# Patient Record
Sex: Male | Born: 1960 | Race: Black or African American | Hispanic: No | Smoking: Current every day smoker
Health system: Southern US, Community
[De-identification: ages and names within clinical notes are randomized; demographics above are authoritative.]

## PROBLEM LIST (undated history)

## (undated) HISTORY — PX: PROSTATE SURGERY: SHX751

## (undated) HISTORY — PX: HIP SURGERY: SHX245

---

## 2006-09-25 ENCOUNTER — Ambulatory Visit: Payer: Self-pay | Admitting: Family Medicine

## 2006-09-25 LAB — CONVERTED CEMR LAB
AST: 22 units/L (ref 0–37)
Basophils Relative: 0.4 % (ref 0.0–1.0)
CO2: 30 meq/L (ref 19–32)
Cholesterol: 168 mg/dL (ref 0–200)
Creatinine, Ser: 1 mg/dL (ref 0.4–1.5)
Eosinophils Relative: 4.4 % (ref 0.0–5.0)
Glucose, Bld: 98 mg/dL (ref 70–99)
HCT: 41.3 % (ref 39.0–52.0)
Hemoglobin: 13.9 g/dL (ref 13.0–17.0)
LDL Cholesterol: 107 mg/dL — ABNORMAL HIGH (ref 0–99)
MCHC: 33.7 g/dL (ref 30.0–36.0)
Monocytes Absolute: 0.7 10*3/uL (ref 0.2–0.7)
Neutrophils Relative %: 59.9 % (ref 43.0–77.0)
PSA: 2.7 ng/mL (ref 0.10–4.00)
Potassium: 4.1 meq/L (ref 3.5–5.1)
RDW: 13.8 % (ref 11.5–14.6)
Sodium: 142 meq/L (ref 135–145)
TSH: 1.16 microintl units/mL (ref 0.35–5.50)
Total Bilirubin: 0.7 mg/dL (ref 0.3–1.2)
Total Protein: 6.9 g/dL (ref 6.0–8.3)
VLDL: 10 mg/dL (ref 0–40)
WBC: 7.4 10*3/uL (ref 4.5–10.5)

## 2010-08-07 NOTE — Assessment & Plan Note (Signed)
[facility] OFFICE NOTE   NAME:GRAVESKeishaun, Hazel                       MRN:          [id number]  DATE:09/25/2006                            DOB:          05/22/1960    This is a 50 year old gentleman here to establish with our practice.  He  is also for complete physical examination.  He does have one item he  would like me to address.  About 2 years ago he noticed a small lump  around the left testicle, it has been there ever since, it does not  particularly bother him and does not seem to be changing.  Occasionally  he also notices some very discomfort during ejaculations, most of the  time this is not present and at other times he has no scrotal or  testicular pain at all, no trouble with urinations.  He has seen no  blood in the semen or in the urine.  He did have a vasectomy in 100.   OTHER PAST MEDICAL HISTORY:  1. It has been years since he has seen a doctor for anything.  2. He checks his blood pressure at local pharmacies and occasionally      gets a reading that is a bit high but he has never been diagnosed      with hypertension.  3. 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  4. He has had a tonsillectomy.   ALLERGIES:  None.   CURRENT MEDICATIONS:  None.   HABITS:  He smokes 1/2 pack per day of cigarettes, he drinks some  alcohol.   SOCIAL HISTORY:  1. He is divorced and now engaged again.  2. He works as a Event organiser.   FAMILY HISTORY:  Remarkable for:  1. Hypertension.  2. Diabetes.   OBJECTIVE:  Height 6 feet 2 inches, weight 223, BP 96/72, pulse 70 and  regular.  GENERAL:  He appears to be healthy.  Skin is clear.  EYES:  Sclera is clear.  OROPHARYNX:  Clear.  NECK:  Supple without lymphadenopathy or masses.  LUNGS:  Clear.  CARDIAC:  Rate and rhythm  regular without gallops, murmurs or rubs.  Distal pulses are full.  ABDOMEN:  Soft, normal bowel sounds, nontender, no masses.  GENITALIA:  He is circumcised.  There is a small nontender, well  circumscribed, cystic lesion at the superior pole of the left testicle.  Otherwise, testicles and epididymides are nontender.  RECTAL EXAM:  No masses or tenderness.  Prostate is mildly enlarged but  smooth.  Stool is hemoccult negative.  EXTREMITIES:  No clubbing, cyanosis or edema.  NEUROLOGIC EXAM:  Grossly intact.   ASSESSMENT AND PLAN:  1. Complete physical.  He is fasting, so we will send him for the      usual laboratories.  I talked about getting more regular exercise      and the importance of stopping smoking.  2. Scrotal lump, probably an epididymal cyst.  Will set up a scrotal      ultrasound soon for further evaluation.  3. Occasional discomfort during ejaculation.  I told him this is      probably a benign problem and he admits that it does not really      bother him that much.  Assuming his urinalysis and laboratories are      normal, I think we will simply observe it at this point and work it      up further if it bothers him more at some point.     Tera Mater. Clent Ridges, MD  Electronically Signed    SAF/MedQ  DD: 09/25/2006  DT: 09/25/2006  Job #: [id number]

## 2012-11-23 ENCOUNTER — Emergency Department (HOSPITAL_BASED_OUTPATIENT_CLINIC_OR_DEPARTMENT_OTHER): Payer: Non-veteran care

## 2012-11-23 ENCOUNTER — Emergency Department (HOSPITAL_BASED_OUTPATIENT_CLINIC_OR_DEPARTMENT_OTHER)
Admission: EM | Admit: 2012-11-23 | Discharge: 2012-11-23 | Disposition: A | Discharge status: Other Acute Inpatient Hospital | Payer: Non-veteran care | Attending: Emergency Medicine | Admitting: Emergency Medicine

## 2012-11-23 ENCOUNTER — Encounter (HOSPITAL_BASED_OUTPATIENT_CLINIC_OR_DEPARTMENT_OTHER): Payer: Self-pay | Admitting: *Deleted

## 2012-11-23 DIAGNOSIS — K56609 Unspecified intestinal obstruction, unspecified as to partial versus complete obstruction: Secondary | ICD-10-CM | POA: Insufficient documentation

## 2012-11-23 DIAGNOSIS — Z79899 Other long term (current) drug therapy: Secondary | ICD-10-CM | POA: Insufficient documentation

## 2012-11-23 DIAGNOSIS — Z87891 Personal history of nicotine dependence: Secondary | ICD-10-CM | POA: Insufficient documentation

## 2012-11-23 DIAGNOSIS — R110 Nausea: Secondary | ICD-10-CM | POA: Insufficient documentation

## 2012-11-23 LAB — URINE MICROSCOPIC-ADD ON

## 2012-11-23 LAB — URINALYSIS, ROUTINE W REFLEX MICROSCOPIC
Nitrite: NEGATIVE
Protein, ur: 30 mg/dL — AB
Urobilinogen, UA: 0.2 mg/dL (ref 0.0–1.0)

## 2012-11-23 LAB — CBC WITH DIFFERENTIAL/PLATELET
Eosinophils Relative: 0 % (ref 0–5)
HCT: 37.3 % — ABNORMAL LOW (ref 39.0–52.0)
Hemoglobin: 13.1 g/dL (ref 13.0–17.0)
Lymphocytes Relative: 11 % — ABNORMAL LOW (ref 12–46)
Lymphs Abs: 0.8 10*3/uL (ref 0.7–4.0)
MCV: 92.1 fL (ref 78.0–100.0)
Monocytes Absolute: 1.3 10*3/uL — ABNORMAL HIGH (ref 0.1–1.0)
Monocytes Relative: 17 % — ABNORMAL HIGH (ref 3–12)
Neutro Abs: 5.3 10*3/uL (ref 1.7–7.7)
WBC: 7.4 10*3/uL (ref 4.0–10.5)

## 2012-11-23 LAB — COMPREHENSIVE METABOLIC PANEL
AST: 28 U/L (ref 0–37)
BUN: 8 mg/dL (ref 6–23)
CO2: 26 mEq/L (ref 19–32)
Chloride: 99 mEq/L (ref 96–112)
Creatinine, Ser: 0.9 mg/dL (ref 0.50–1.35)
GFR calc Af Amer: >90 mL/min (ref >90)
GFR calc non Af Amer: >90 mL/min (ref >90)
Glucose, Bld: 124 mg/dL — ABNORMAL HIGH (ref 70–99)
Total Bilirubin: 0.6 mg/dL (ref 0.3–1.2)

## 2012-11-23 MED ORDER — HYDROMORPHONE HCL PF 1 MG/ML IJ SOLN
0.5000 mg | Freq: Once | INTRAMUSCULAR | Status: AC
  Administered 2012-11-23: 0.5 mg via INTRAVENOUS

## 2012-11-23 MED ORDER — HYDROMORPHONE HCL PF 1 MG/ML IJ SOLN
0.5000 mg | Freq: Once | INTRAMUSCULAR | Status: AC
  Administered 2012-11-23: 0.5 mg via INTRAVENOUS
  Filled 2012-11-23: qty 1

## 2012-11-23 MED ORDER — LIDOCAINE VISCOUS 2 % MT SOLN
OROMUCOSAL | Status: AC
  Administered 2012-11-23: 22:00:00
  Filled 2012-11-23: qty 15

## 2012-11-23 MED ORDER — SODIUM CHLORIDE 0.9 % IV SOLN
Freq: Once | INTRAVENOUS | Status: AC
  Administered 2012-11-23: 19:00:00 via INTRAVENOUS

## 2012-11-23 MED ORDER — ONDANSETRON HCL 4 MG/2ML IJ SOLN
4.0000 mg | Freq: Once | INTRAMUSCULAR | Status: AC
  Administered 2012-11-23: 4 mg via INTRAVENOUS
  Filled 2012-11-23: qty 2

## 2012-11-23 MED ORDER — HYDROMORPHONE HCL PF 1 MG/ML IJ SOLN
INTRAMUSCULAR | Status: AC
  Filled 2012-11-23: qty 1

## 2012-11-23 MED ORDER — IOHEXOL 300 MG/ML  SOLN
100.0000 mL | Freq: Once | INTRAMUSCULAR | Status: AC | PRN
  Administered 2012-11-23: 100 mL via INTRAVENOUS

## 2012-11-23 MED ORDER — HYDROMORPHONE HCL PF 1 MG/ML IJ SOLN
INTRAMUSCULAR | Status: AC
  Administered 2012-11-23: 0.5 mg
  Filled 2012-11-23: qty 1

## 2012-11-23 MED ORDER — SODIUM CHLORIDE 0.9 % IV BOLUS (SEPSIS)
1000.0000 mL | Freq: Once | INTRAVENOUS | Status: AC
  Administered 2012-11-23: 1000 mL via INTRAVENOUS

## 2012-11-23 MED ORDER — IOHEXOL 300 MG/ML  SOLN
50.0000 mL | Freq: Once | INTRAMUSCULAR | Status: AC | PRN
  Administered 2012-11-23: 50 mL via ORAL

## 2012-11-23 NOTE — ED Provider Notes (Signed)
CSN: [id number]     Arrival date & time 11/23/12  1657 History  This chart was scribed for Vida Roller, MD by Leone Payor, ED Scribe. This patient was seen in room MH10/MH10 and the patient's care was started 5:20 PM.    Chief Complaint  Patient presents with  . Abdominal Pain    The history is provided by the patient. No language interpreter was used.    HPI Comments: Justin Bauer is a 52 y.o. male who presents to the Emergency Department complaining of constant, worsening abdominal pain with associated bloating and nausea that began yesterday. Pt reports having prostate surgery 5 days ago at the [facility]. He reports last BM was prior to the surgery. He also is having drainage from one of the incision sites on the left abdomen. He denies fever, SOB, back pain, emesis. He denies history of other abdominal surgeries.   History reviewed. No pertinent past medical history. Past Surgical History  Procedure Laterality Date  . Prostate surgery    . Hip surgery     No family history on file. History  Substance Use Topics  . Smoking status: Former Smoker    Types: Cigarettes    Quit date: 09/22/2012  . Smokeless tobacco: Not on file  . Alcohol Use: Yes    Review of Systems A complete 10 system review of systems was obtained and all systems are negative except as noted in the HPI and PMH.   Allergies  Review of patient's allergies indicates no known allergies.  Home Medications   Current Outpatient Rx  Name  Route  Sig  Dispense  Refill  . ferrous sulfate 325 (65 FE) MG tablet   Oral   Take 325 mg by mouth daily with breakfast.         . MELOXICAM PO   Oral   Take by mouth.         . oxybutynin (DITROPAN) 5 MG tablet   Oral   Take 5 mg by mouth 3 (three) times daily.         . Oxycodone-Acetaminophen (PERCOCET PO)   Oral   Take by mouth.         . senna (SENOKOT) 8.6 MG tablet   Oral   Take 1 tablet by mouth daily.          BP 149/91  Pulse 68   Temp(Src) 98.6 F (37 C) (Oral)  Resp 20  Ht 6\' 2"  (1.88 m)  Wt 220 lb (99.791 kg)  BMI 28.23 kg/m2  SpO2 100% Physical Exam  Nursing note and vitals reviewed. Constitutional: He is oriented to person, place, and time. He appears well-developed and well-nourished. No distress.  Appears uncomfortable.  HENT:  Head: Normocephalic and atraumatic.  Mouth/Throat: Oropharynx is clear and moist. Mucous membranes are dry.  Eyes: Conjunctivae are normal. Pupils are equal, round, and reactive to light. No scleral icterus.  Neck: Neck supple.  Cardiovascular: Normal rate, regular rhythm, normal heart sounds and intact distal pulses.   No murmur heard. Pulmonary/Chest: Effort normal and breath sounds normal. No stridor. No respiratory distress. He has no wheezes. He has no rales.  Abdominal: Soft. He exhibits no distension. There is tenderness (abdominal tenderness, upper greater than lower).  Diffusely tympanitic to percussion. Several incisions most of which are healing. LLQ has a 1.5 cm incision that is open with clear serous drainage.  Musculoskeletal: Normal range of motion. He exhibits no edema.  Neurological: He is alert and oriented  to person, place, and time.  Skin: Skin is warm and dry. No rash noted.  Right lower abdominal wall is ecchymotic.   Psychiatric: He has a normal mood and affect. His behavior is normal.    ED Course  Procedures (including critical care time)  DIAGNOSTIC STUDIES: Oxygen Saturation is 99% on RA, normal by my interpretation.    COORDINATION OF CARE: 5:25 PM Discussed treatment plan with pt at bedside and pt agreed to plan.   Labs Review Labs Reviewed  CBC WITH DIFFERENTIAL - Abnormal; Notable for the following:    RBC 4.05 (*)    HCT 37.3 (*)    Lymphocytes Relative 11 (*)    Monocytes Relative 17 (*)    Monocytes Absolute 1.3 (*)    All other components within normal limits  COMPREHENSIVE METABOLIC PANEL - Abnormal; Notable for the following:     Glucose, Bld 124 (*)    All other components within normal limits  URINALYSIS, ROUTINE W REFLEX MICROSCOPIC - Abnormal; Notable for the following:    Color, Urine AMBER (*)    APPearance CLOUDY (*)    pH 8.5 (*)    Hgb urine dipstick LARGE (*)    Bilirubin Urine SMALL (*)    Ketones, ur 40 (*)    Protein, ur 30 (*)    Leukocytes, UA SMALL (*)    All other components within normal limits  URINE MICROSCOPIC-ADD ON   Imaging Review Ct Abdomen Pelvis W Contrast  11/23/2012   *RADIOLOGY REPORT*  Clinical Data: Abdominal pain status post prostatectomy. Abdomen distended.  CT ABDOMEN AND PELVIS WITH CONTRAST  Technique:  Multidetector CT imaging of the abdomen and pelvis was performed following the standard protocol during bolus administration of intravenous contrast.  Contrast: 50mL OMNIPAQUE IOHEXOL 300 MG/ML  SOLN, OMNIPAQUE IOHEXOL 300 MG/ML  SOLN  Comparison: None.  Findings: The scout view of the CT demonstrates multiple loops of dilated bowel.  Lung bases:  There are patchy areas of atelectasis in the right lower lobe.  Negative for pleural or pericardial effusion.  The liver, gallbladder, spleen, adrenal glands, pancreas, and kidneys are within normal limits.  The abdominal aorta contains atherosclerotic calcification and is normal in caliber.  There is some generalized edema of the body wall, particularly along the lateral aspects of the abdomen bilaterally right greater than left.  There are several locules of gas in the right anterior and lateral abdominal wall subcutaneous fat and fascial planes, and in the left anterior abdominal wall subcutaneous fat.  The stomach contains oral contrast and is moderately distended. There are multiple markedly distended loops of proximal and mid small bowel.  Small bowel loops measure up to 4 cm in caliber. There are air-fluid levels within the dilated small bowel, and there is some fecal material with one of the left mid abdominal small bowel loops.   There is a right anterolateral abdominal wall hernia, just lateral to the right rectus abdominus muscle, most consistent with a Spigelian hernia.  This hernia contains a small knuckle of small bowel.  Distal to this hernia, the small bowel loops appear nearly completely decompressed.  The hernia is felt to be the cause of the small bowel obstruction.  There is no definite small bowel wall thickening.  There is some fluid in the proximal colon.  The descending colon and sigmoid colon are completely decompressed.  The urinary bladder is decompressed by a Foley catheter.  There is streak artifact in the pelvis from the  patient's left hip arthroplasty. Evaluation of the prostatectomy site is limited by the streak artifact.  No definite evidence of pelvic abscess.  There is some fluid in the presacral space.  No free intra- abdominal air is identified.  No lymphadenopathy identified.  The abdominal aorta is normal in caliber.  There are locules of gas in the inguinal canals bilaterally.  There are degenerative changes of the lower lumbar spine, with disc space narrowing and vacuum disc phenomenon at L3-4, L4-5, and L5 S1.  There are moderate degenerative changes of the right hip.  No focal sclerotic lesions are identified in the visualized spine.  IMPRESSION:  1.  Small bowel obstruction.  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 This finding was discussed with Dr. Hyacinth Meeker by telephone 7:40 pm 11/23/2012. 2.  Subcutaneous gas in the abdominal wall bilaterally is likely due to recent surgery. Generalized edema of the body wall bilaterally, right greater than left. This could be due to bruising/ecchymosis, given reported clinical history of ecchymosis of the body wall. 3.  Small amount of fluid in the pelvis. No evidence of pelvic abscess; evaluation is limited by marked streak artifact by the patient's left hip arthroplasty.                                4.  Urinary bladder decompressed by Foley catheter. 5.  Left renal 6 mm too small to characterize low density lesion.   Original Report Authenticated By: Britta Mccreedy, M.D.    MDM   1. SBO (small bowel obstruction)    Pt has exam c/w SBO - labs show normal CMP - CBC normal, UA with dehydradtion - CT shows SBO -   Multiple doses of pain meds given, fluids, remained stable in the Ed.  NG tube placed,   [facility]  Meds given in ED:  Medications  sodium chloride 0.9 % bolus 1,000 mL (0 mLs Intravenous Stopped 11/23/12 1859)  0.9 %  sodium chloride infusion ( Intravenous New Bag/Given 11/23/12 1900)  HYDROmorphone (DILAUDID) injection 0.5 mg (0.5 mg Intravenous Given 11/23/12 1807)  ondansetron (ZOFRAN) injection 4 mg (4 mg Intravenous Given 11/23/12 1807)  iohexol (OMNIPAQUE) 300 MG/ML solution 50 mL (50 mLs Oral Contrast Given 11/23/12 1739)  HYDROmorphone (DILAUDID) injection 0.5 mg (0.5 mg Intravenous Given 11/23/12 1857)  iohexol (OMNIPAQUE) 300 MG/ML solution 100 mL (100 mLs Intravenous Contrast Given 11/23/12 1908)  HYDROmorphone (DILAUDID) injection 0.5 mg (0.5 mg Intravenous Given 11/23/12 2057)    New Prescriptions   No medications on file    I personally performed the services described in this documentation, which was scribed in my presence. The recorded information has been reviewed and is accurate.      Vida Roller, MD 11/23/12 2101

## 2012-11-23 NOTE — ED Notes (Signed)
Prostate surgery 5 days ago. Here today with drainage from incision.

## 2012-11-24 MED FILL — Hydromorphone HCl Preservative Free (PF) Inj 2 MG/ML: INTRAMUSCULAR | Qty: 1 | Status: AC

## 2012-11-24 MED FILL — Ondansetron HCl Inj 4 MG/2ML (2 MG/ML): INTRAMUSCULAR | Qty: 2 | Status: AC

## 2021-08-26 ENCOUNTER — Emergency Department (HOSPITAL_COMMUNITY): Payer: No Typology Code available for payment source

## 2021-08-26 ENCOUNTER — Encounter (HOSPITAL_COMMUNITY): Payer: Self-pay | Admitting: Pharmacy Technician

## 2021-08-26 ENCOUNTER — Inpatient Hospital Stay (HOSPITAL_COMMUNITY)
Admission: EM | Admit: 2021-08-26 | Discharge: 2021-09-08 | DRG: 199 | Disposition: A | Discharge status: Rehabilitation Facility | Payer: No Typology Code available for payment source | Attending: General Surgery | Admitting: General Surgery

## 2021-08-26 ENCOUNTER — Other Ambulatory Visit: Payer: Self-pay

## 2021-08-26 DIAGNOSIS — S270XXD Traumatic pneumothorax, subsequent encounter: Secondary | ICD-10-CM | POA: Diagnosis not present

## 2021-08-26 DIAGNOSIS — Y95 Nosocomial condition: Secondary | ICD-10-CM | POA: Diagnosis not present

## 2021-08-26 DIAGNOSIS — S2241XA Multiple fractures of ribs, right side, initial encounter for closed fracture: Secondary | ICD-10-CM | POA: Diagnosis present

## 2021-08-26 DIAGNOSIS — E871 Hypo-osmolality and hyponatremia: Secondary | ICD-10-CM | POA: Diagnosis not present

## 2021-08-26 DIAGNOSIS — R339 Retention of urine, unspecified: Secondary | ICD-10-CM | POA: Diagnosis not present

## 2021-08-26 DIAGNOSIS — K5903 Drug induced constipation: Secondary | ICD-10-CM | POA: Diagnosis not present

## 2021-08-26 DIAGNOSIS — Z20822 Contact with and (suspected) exposure to covid-19: Secondary | ICD-10-CM | POA: Diagnosis present

## 2021-08-26 DIAGNOSIS — R402142 Coma scale, eyes open, spontaneous, at arrival to emergency department: Secondary | ICD-10-CM | POA: Diagnosis present

## 2021-08-26 DIAGNOSIS — S42034A Nondisplaced fracture of lateral end of right clavicle, initial encounter for closed fracture: Secondary | ICD-10-CM | POA: Diagnosis present

## 2021-08-26 DIAGNOSIS — J918 Pleural effusion in other conditions classified elsewhere: Secondary | ICD-10-CM | POA: Diagnosis present

## 2021-08-26 DIAGNOSIS — J189 Pneumonia, unspecified organism: Secondary | ICD-10-CM | POA: Diagnosis not present

## 2021-08-26 DIAGNOSIS — T797XXA Traumatic subcutaneous emphysema, initial encounter: Secondary | ICD-10-CM | POA: Diagnosis present

## 2021-08-26 DIAGNOSIS — F17210 Nicotine dependence, cigarettes, uncomplicated: Secondary | ICD-10-CM | POA: Diagnosis present

## 2021-08-26 DIAGNOSIS — D62 Acute posthemorrhagic anemia: Secondary | ICD-10-CM | POA: Diagnosis present

## 2021-08-26 DIAGNOSIS — S22059D Unspecified fracture of T5-T6 vertebra, subsequent encounter for fracture with routine healing: Secondary | ICD-10-CM | POA: Diagnosis not present

## 2021-08-26 DIAGNOSIS — R402362 Coma scale, best motor response, obeys commands, at arrival to emergency department: Secondary | ICD-10-CM | POA: Diagnosis present

## 2021-08-26 DIAGNOSIS — R402242 Coma scale, best verbal response, confused conversation, at arrival to emergency department: Secondary | ICD-10-CM | POA: Diagnosis present

## 2021-08-26 DIAGNOSIS — S060XAA Concussion with loss of consciousness status unknown, initial encounter: Secondary | ICD-10-CM | POA: Diagnosis present

## 2021-08-26 DIAGNOSIS — S42114A Nondisplaced fracture of body of scapula, right shoulder, initial encounter for closed fracture: Secondary | ICD-10-CM | POA: Diagnosis present

## 2021-08-26 DIAGNOSIS — S22058A Other fracture of T5-T6 vertebra, initial encounter for closed fracture: Secondary | ICD-10-CM | POA: Diagnosis present

## 2021-08-26 DIAGNOSIS — S42124A Nondisplaced fracture of acromial process, right shoulder, initial encounter for closed fracture: Secondary | ICD-10-CM | POA: Diagnosis present

## 2021-08-26 DIAGNOSIS — S22058D Other fracture of T5-T6 vertebra, subsequent encounter for fracture with routine healing: Secondary | ICD-10-CM | POA: Diagnosis not present

## 2021-08-26 DIAGNOSIS — J939 Pneumothorax, unspecified: Secondary | ICD-10-CM | POA: Diagnosis present

## 2021-08-26 DIAGNOSIS — Z23 Encounter for immunization: Secondary | ICD-10-CM | POA: Diagnosis not present

## 2021-08-26 DIAGNOSIS — D696 Thrombocytopenia, unspecified: Secondary | ICD-10-CM | POA: Diagnosis not present

## 2021-08-26 DIAGNOSIS — D72829 Elevated white blood cell count, unspecified: Secondary | ICD-10-CM | POA: Diagnosis not present

## 2021-08-26 DIAGNOSIS — S270XXA Traumatic pneumothorax, initial encounter: Principal | ICD-10-CM | POA: Diagnosis present

## 2021-08-26 DIAGNOSIS — T07XXXA Unspecified multiple injuries, initial encounter: Secondary | ICD-10-CM | POA: Diagnosis not present

## 2021-08-26 DIAGNOSIS — K5900 Constipation, unspecified: Secondary | ICD-10-CM | POA: Diagnosis not present

## 2021-08-26 LAB — I-STAT CHEM 8, ED
BUN: 20 mg/dL (ref 8–23)
Calcium, Ion: 1 mmol/L — ABNORMAL LOW (ref 1.15–1.40)
Chloride: 105 mmol/L (ref 98–111)
Creatinine, Ser: 1 mg/dL (ref 0.61–1.24)
Glucose, Bld: 90 mg/dL (ref 70–99)
HCT: 44 % (ref 39.0–52.0)
Hemoglobin: 15 g/dL (ref 13.0–17.0)
Potassium: 4.3 mmol/L (ref 3.5–5.1)
Sodium: 140 mmol/L (ref 135–145)
TCO2: 22 mmol/L (ref 22–32)

## 2021-08-26 LAB — COMPREHENSIVE METABOLIC PANEL
ALT: 25 U/L (ref 0–44)
AST: 52 U/L — ABNORMAL HIGH (ref 15–41)
Albumin: 3.2 g/dL — ABNORMAL LOW (ref 3.5–5.0)
Alkaline Phosphatase: 51 U/L (ref 38–126)
Anion gap: 10 (ref 5–15)
BUN: 17 mg/dL (ref 8–23)
CO2: 21 mmol/L — ABNORMAL LOW (ref 22–32)
Calcium: 8.1 mg/dL — ABNORMAL LOW (ref 8.9–10.3)
Chloride: 107 mmol/L (ref 98–111)
Creatinine, Ser: 0.92 mg/dL (ref 0.61–1.24)
GFR, Estimated: >60 mL/min (ref >60)
Glucose, Bld: 95 mg/dL (ref 70–99)
Potassium: 4.4 mmol/L (ref 3.5–5.1)
Sodium: 138 mmol/L (ref 135–145)
Total Bilirubin: 0.6 mg/dL (ref 0.3–1.2)
Total Protein: 5.8 g/dL — ABNORMAL LOW (ref 6.5–8.1)

## 2021-08-26 LAB — PROTIME-INR
INR: 1 (ref 0.8–1.2)
Prothrombin Time: 13.5 seconds (ref 11.4–15.2)

## 2021-08-26 LAB — URINALYSIS, ROUTINE W REFLEX MICROSCOPIC
Bilirubin Urine: NEGATIVE
Glucose, UA: NEGATIVE mg/dL
Hgb urine dipstick: NEGATIVE
Ketones, ur: 20 mg/dL — AB
Leukocytes,Ua: NEGATIVE
Nitrite: NEGATIVE
Protein, ur: NEGATIVE mg/dL
Specific Gravity, Urine: >1.046 — ABNORMAL HIGH (ref 1.005–1.030)
pH: 5 (ref 5.0–8.0)

## 2021-08-26 LAB — CBC
HCT: 41.5 % (ref 39.0–52.0)
Hemoglobin: 14.2 g/dL (ref 13.0–17.0)
MCH: 33 pg (ref 26.0–34.0)
MCHC: 34.2 g/dL (ref 30.0–36.0)
MCV: 96.5 fL (ref 80.0–100.0)
Platelets: 255 10*3/uL (ref 150–400)
RBC: 4.3 MIL/uL (ref 4.22–5.81)
RDW: 14.4 % (ref 11.5–15.5)
WBC: 10.4 10*3/uL (ref 4.0–10.5)
nRBC: 0 % (ref 0.0–0.2)

## 2021-08-26 LAB — HIV ANTIBODY (ROUTINE TESTING W REFLEX): HIV Screen 4th Generation wRfx: NONREACTIVE

## 2021-08-26 LAB — SAMPLE TO BLOOD BANK

## 2021-08-26 LAB — ETHANOL: Alcohol, Ethyl (B): 56 mg/dL — ABNORMAL HIGH (ref <10)

## 2021-08-26 LAB — RESP PANEL BY RT-PCR (FLU A&B, COVID) ARPGX2
Influenza A by PCR: NEGATIVE
Influenza B by PCR: NEGATIVE
SARS Coronavirus 2 by RT PCR: NEGATIVE

## 2021-08-26 LAB — LACTIC ACID, PLASMA: Lactic Acid, Venous: 3.4 mmol/L (ref 0.5–1.9)

## 2021-08-26 MED ORDER — ONDANSETRON 4 MG PO TBDP: 4.0000 mg | ORAL_TABLET | Freq: Four times a day (QID) | ORAL | Status: DC | PRN

## 2021-08-26 MED ORDER — DEXTROSE 5 % IV SOLN: 500.0000 mg | Freq: Three times a day (TID) | INTRAVENOUS | Status: DC | PRN

## 2021-08-26 MED ORDER — ZIPRASIDONE MESYLATE 20 MG IM SOLR: 10.0000 mg | Freq: Once | INTRAMUSCULAR | Status: DC

## 2021-08-26 MED ORDER — SODIUM CHLORIDE 0.9 % IV SOLN
INTRAVENOUS | Status: AC | PRN
  Administered 2021-08-26: 1000 mL via INTRAVENOUS

## 2021-08-26 MED ORDER — HYDROMORPHONE HCL 1 MG/ML IJ SOLN
1.0000 mg | INTRAMUSCULAR | Status: DC | PRN
  Administered 2021-08-26 – 2021-08-27 (×10): 1 mg via INTRAVENOUS
  Filled 2021-08-26 (×10): qty 1

## 2021-08-26 MED ORDER — TETANUS-DIPHTH-ACELL PERTUSSIS 5-2.5-18.5 LF-MCG/0.5 IM SUSY
0.5000 mL | PREFILLED_SYRINGE | Freq: Once | INTRAMUSCULAR | Status: AC
  Administered 2021-08-26: 0.5 mL via INTRAMUSCULAR

## 2021-08-26 MED ORDER — OXYCODONE HCL 5 MG PO TABS
5.0000 mg | ORAL_TABLET | ORAL | Status: DC | PRN
  Administered 2021-08-26 – 2021-08-28 (×2): 10 mg via ORAL
  Filled 2021-08-26 (×2): qty 2

## 2021-08-26 MED ORDER — ONDANSETRON HCL 4 MG/2ML IJ SOLN
4.0000 mg | Freq: Four times a day (QID) | INTRAMUSCULAR | Status: DC | PRN
  Administered 2021-08-26: 4 mg via INTRAVENOUS
  Filled 2021-08-26: qty 2

## 2021-08-26 MED ORDER — MORPHINE SULFATE (PF) 2 MG/ML IV SOLN
2.0000 mg | INTRAVENOUS | Status: DC | PRN
  Administered 2021-08-26 (×2): 4 mg via INTRAVENOUS
  Filled 2021-08-26 (×2): qty 2

## 2021-08-26 MED ORDER — OXYCODONE HCL 5 MG PO TABS: 5.0000 mg | ORAL_TABLET | ORAL | Status: DC | PRN

## 2021-08-26 MED ORDER — METHOCARBAMOL 1000 MG/10ML IJ SOLN
500.0000 mg | Freq: Three times a day (TID) | INTRAMUSCULAR | Status: DC
Start: 2021-08-26 — End: 2021-08-27
  Administered 2021-08-26 – 2021-08-27 (×2): 500 mg via INTRAVENOUS
  Filled 2021-08-26 (×2): qty 500

## 2021-08-26 MED ORDER — METOPROLOL TARTRATE 5 MG/5ML IV SOLN: 5.0000 mg | Freq: Four times a day (QID) | INTRAVENOUS | Status: DC | PRN

## 2021-08-26 MED ORDER — FENTANYL CITRATE PF 50 MCG/ML IJ SOSY
50.0000 ug | PREFILLED_SYRINGE | Freq: Once | INTRAMUSCULAR | Status: AC
  Administered 2021-08-26: 50 ug via INTRAVENOUS
  Filled 2021-08-26: qty 1

## 2021-08-26 MED ORDER — HYDRALAZINE HCL 20 MG/ML IJ SOLN: 10.0000 mg | INTRAMUSCULAR | Status: DC | PRN

## 2021-08-26 MED ORDER — ACETAMINOPHEN 325 MG PO TABS: 650.0000 mg | ORAL_TABLET | ORAL | Status: DC | PRN

## 2021-08-26 MED ORDER — DOCUSATE SODIUM 100 MG PO CAPS
100.0000 mg | ORAL_CAPSULE | Freq: Two times a day (BID) | ORAL | Status: DC
  Administered 2021-08-27 – 2021-09-06 (×18): 100 mg via ORAL
  Filled 2021-08-26 (×23): qty 1

## 2021-08-26 MED ORDER — GABAPENTIN 300 MG PO CAPS
300.0000 mg | ORAL_CAPSULE | Freq: Three times a day (TID) | ORAL | Status: DC
  Administered 2021-08-26 – 2021-08-30 (×11): 300 mg via ORAL
  Filled 2021-08-26 (×11): qty 1

## 2021-08-26 MED ORDER — ACETAMINOPHEN 500 MG PO TABS
1000.0000 mg | ORAL_TABLET | Freq: Four times a day (QID) | ORAL | Status: DC
  Administered 2021-08-26 – 2021-09-08 (×45): 1000 mg via ORAL
  Filled 2021-08-26 (×46): qty 2

## 2021-08-26 MED ORDER — IOHEXOL 350 MG/ML SOLN
100.0000 mL | Freq: Once | INTRAVENOUS | Status: AC | PRN
  Administered 2021-08-26: 100 mL via INTRAVENOUS

## 2021-08-26 MED ORDER — DEXTROSE-NACL 5-0.45 % IV SOLN: INTRAVENOUS | Status: DC

## 2021-08-26 NOTE — ED Notes (Signed)
Neurosurgery at the bedside.
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
## 2021-08-26 NOTE — ED Notes (Addendum)
Pt alert and oriented, presenting in significant discomfort, reporting 10/10 pain in upper right arm. 4mg  morphine administered for pain. PMS present in all extremities. [place] J in place, bed at 20 degrees. Right chest tube in place and properly secured to patient, below bed level, and hooked to suction. No output at this time. Pt is on 4L [place].
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
## 2021-08-26 NOTE — ED Notes (Signed)
Per Dr. Franky Macho, pt [facility] to remain 20 degrees or below.

## 2021-08-26 NOTE — ED Notes (Signed)
Friend -- Al -- [phone] left a message -- RN phone number given for call back
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
## 2021-08-26 NOTE — ED Notes (Signed)
Time out performed
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
## 2021-08-26 NOTE — ED Notes (Signed)
Pt able to move all four extremities. CNS intact. Pt placed in [place] j.

## 2021-08-26 NOTE — Progress Notes (Signed)
Orthopedic Tech Progress Note Patient Details:  Justin Bauer 06/05/1960 [id number]  Patient ID: [name], male   DOB: Mar 20, 1961, 61 y.o.   MRN: [id number] Level II; not needed at the moment.  Darleen Crocker 08/26/2021, 4:06 PM

## 2021-08-26 NOTE — ED Notes (Signed)
Dr. Kinsinger at the bedside. 

## 2021-08-26 NOTE — Procedures (Signed)
Chest tube insertion  Date/Time: 08/26/2021 6:06 PM Performed by: Rodman Pickle, MD Authorized by: Rodman Pickle, MD   Consent:    Consent obtained:  Verbal   Consent given by:  Patient   Risks discussed:  Bleeding, incomplete drainage, damage to surrounding structures and pain   Alternatives discussed:  No treatment Pre-procedure details:    Skin preparation:  ChloraPrep Anesthesia (see MAR for exact dosages):    Anesthesia method:  Local infiltration   Local anesthetic:  Lidocaine 1% w/o epi Procedure details:    Placement location:  R lateral   Scalpel size:  11   Tube size (Fr):  16   Technique: Seldinger     Ultrasound guidance: no     Tension pneumothorax: no     Tube connected to:  Suction   Drainage characteristics:  Air only   Suture material:  2-0 silk   Dressing:  4x4 sterile gauze Post-procedure details:    Post-insertion x-ray findings: tube in good position     Patient tolerance of procedure:  Tolerated well, no immediate complications

## 2021-08-26 NOTE — Progress Notes (Signed)
Trauma Event Note   TRN at bedside to round, patient remains in ED waiting for bed in hospital. Significant pain poorly controlled by morphine q1h, VS WDL for age. Trauma MD made aware via text, new orders for dilaudid q1h. [place] J collar in place and aligned appropriately. Chest tube right lateral chest to suction, sahara with air only.   Patient requesting to be transferred to [facility].  Last imported Vital Signs BP 125/75   Pulse 93   Temp 98 F (36.7 C)   Resp 19   Ht 6\' 3"  (1.905 m)   Wt 212 lb (96.2 kg)   SpO2 97%   BMI 26.50 kg/m   Trending CBC Recent Labs    08/26/21 1613 08/26/21 1623  WBC 10.4  --   HGB 14.2 15.0  HCT 41.5 44.0  PLT 255  --     Trending Coag's Recent Labs    08/26/21 1613  INR 1.0    Trending BMET Recent Labs    08/26/21 1613 08/26/21 1623  NA 138 140  K 4.4 4.3  CL 107 105  CO2 21*  --   BUN 17 20  CREATININE 0.92 1.00  GLUCOSE 95 90      Shellia Hartl O Cathleen Yagi  Trauma Response RN  Please call TRN at [phone] for further assistance.

## 2021-08-27 ENCOUNTER — Inpatient Hospital Stay (HOSPITAL_COMMUNITY): Payer: No Typology Code available for payment source

## 2021-08-27 LAB — BASIC METABOLIC PANEL
Anion gap: 4 — ABNORMAL LOW (ref 5–15)
BUN: 14 mg/dL (ref 8–23)
CO2: 27 mmol/L (ref 22–32)
Calcium: 7.9 mg/dL — ABNORMAL LOW (ref 8.9–10.3)
Chloride: 105 mmol/L (ref 98–111)
Creatinine, Ser: 1.06 mg/dL (ref 0.61–1.24)
GFR, Estimated: >60 mL/min (ref >60)
Glucose, Bld: 135 mg/dL — ABNORMAL HIGH (ref 70–99)
Potassium: 4.1 mmol/L (ref 3.5–5.1)
Sodium: 136 mmol/L (ref 135–145)

## 2021-08-27 LAB — CBC
HCT: 37.4 % — ABNORMAL LOW (ref 39.0–52.0)
Hemoglobin: 12.4 g/dL — ABNORMAL LOW (ref 13.0–17.0)
MCH: 32.5 pg (ref 26.0–34.0)
MCHC: 33.2 g/dL (ref 30.0–36.0)
MCV: 98.2 fL (ref 80.0–100.0)
Platelets: 178 10*3/uL (ref 150–400)
RBC: 3.81 MIL/uL — ABNORMAL LOW (ref 4.22–5.81)
RDW: 14.6 % (ref 11.5–15.5)
WBC: 9.8 10*3/uL (ref 4.0–10.5)
nRBC: 0 % (ref 0.0–0.2)

## 2021-08-27 LAB — GLUCOSE, CAPILLARY: Glucose-Capillary: 116 mg/dL — ABNORMAL HIGH (ref 70–99)

## 2021-08-27 MED ORDER — BISACODYL 10 MG RE SUPP: 10.0000 mg | Freq: Every day | RECTAL | Status: DC | PRN

## 2021-08-27 MED ORDER — GUAIFENESIN ER 600 MG PO TB12
600.0000 mg | ORAL_TABLET | Freq: Two times a day (BID) | ORAL | Status: DC
Start: 2021-08-27 — End: 2021-09-08
  Administered 2021-08-27 – 2021-09-08 (×25): 600 mg via ORAL
  Filled 2021-08-27 (×24): qty 1

## 2021-08-27 MED ORDER — LACTATED RINGERS IV SOLN: INTRAVENOUS | Status: DC

## 2021-08-27 MED ORDER — POLYETHYLENE GLYCOL 3350 17 G PO PACK
17.0000 g | PACK | Freq: Every day | ORAL | Status: DC
  Administered 2021-08-27 – 2021-08-28 (×2): 17 g via ORAL
  Filled 2021-08-27 (×2): qty 1

## 2021-08-27 MED ORDER — ENOXAPARIN SODIUM 40 MG/0.4ML IJ SOSY
40.0000 mg | PREFILLED_SYRINGE | Freq: Two times a day (BID) | INTRAMUSCULAR | Status: DC
  Administered 2021-08-27 – 2021-08-29 (×4): 40 mg via SUBCUTANEOUS
  Filled 2021-08-27 (×4): qty 0.4

## 2021-08-27 MED ORDER — BETHANECHOL CHLORIDE 10 MG PO TABS: 10.0000 mg | ORAL_TABLET | Freq: Three times a day (TID) | ORAL | Status: DC

## 2021-08-27 MED ORDER — CALCIUM CARBONATE ANTACID 500 MG PO CHEW
1.0000 | CHEWABLE_TABLET | Freq: Three times a day (TID) | ORAL | Status: DC
  Administered 2021-08-27 – 2021-09-08 (×32): 200 mg via ORAL
  Filled 2021-08-27 (×33): qty 1

## 2021-08-27 MED ORDER — DEXTROSE 5 % IV SOLN
500.0000 mg | Freq: Four times a day (QID) | INTRAVENOUS | Status: DC
Start: 2021-08-27 — End: 2021-08-28
  Administered 2021-08-27 – 2021-08-28 (×3): 500 mg via INTRAVENOUS
  Filled 2021-08-27 (×2): qty 5
  Filled 2021-08-27: qty 500
  Filled 2021-08-27 (×2): qty 5
  Filled 2021-08-27: qty 500

## 2021-08-27 MED ORDER — TAMSULOSIN HCL 0.4 MG PO CAPS
0.4000 mg | ORAL_CAPSULE | Freq: Every day | ORAL | Status: DC
  Administered 2021-08-27 – 2021-09-08 (×13): 0.4 mg via ORAL
  Filled 2021-08-27 (×13): qty 1
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
## 2021-08-27 NOTE — Progress Notes (Signed)
Orthopedic Tech Progress Note Patient Details:  Justin Bauer 18-Apr-1960 [id number]  Per RN patient has SHOULDER SLING   Patient ID: Justin Bauer, male   DOB: 01/12/1961, 61 y.o.   MRN: [id number]  [name] 08/27/2021, 10:44 AM

## 2021-08-27 NOTE — Progress Notes (Signed)
OT Cancellation Note  Patient Details Name: Justin Bauer MRN: [id number] DOB: [birth year]   Cancelled Treatment:    Reason Eval/Treat Not Completed: Medical issues which prohibited therapy Active bedrest order pt with multiple cervical/thoracic spinal fxs, unstable at this time and on bedrest, log roll only. Will hold OT evaluation due to above and will evaluate once orders are progressed. OT will continue to follow.  Pollyann Glen E. Riniyah Speich, OTR/L Acute Rehabilitation Services [phone]   Cherlyn Cushing 08/27/2021, 8:14 AM

## 2021-08-27 NOTE — Progress Notes (Signed)
Orthopedic Tech Progress Note Patient Details:  Justin Bauer 1961/03/09 [id number]  Ortho Devices Type of Ortho Device: Arm sling Ortho Device/Splint Location: delivered to room Ortho Device/Splint Interventions: Rich Brave 08/27/2021, 5:29 AM

## 2021-08-28 ENCOUNTER — Inpatient Hospital Stay (HOSPITAL_COMMUNITY): Payer: No Typology Code available for payment source

## 2021-08-28 LAB — CBC
HCT: 28.6 % — ABNORMAL LOW (ref 39.0–52.0)
Hemoglobin: 9.9 g/dL — ABNORMAL LOW (ref 13.0–17.0)
MCH: 33.2 pg (ref 26.0–34.0)
MCHC: 34.6 g/dL (ref 30.0–36.0)
MCV: 96 fL (ref 80.0–100.0)
Platelets: 114 10*3/uL — ABNORMAL LOW (ref 150–400)
RBC: 2.98 MIL/uL — ABNORMAL LOW (ref 4.22–5.81)
RDW: 14.4 % (ref 11.5–15.5)
WBC: 10.3 10*3/uL (ref 4.0–10.5)
nRBC: 0 % (ref 0.0–0.2)

## 2021-08-28 MED ORDER — HYDROMORPHONE HCL 1 MG/ML IJ SOLN: 1.0000 mg | INTRAMUSCULAR | Status: DC | PRN

## 2021-08-28 MED ORDER — LACTATED RINGERS IV BOLUS
1000.0000 mL | Freq: Once | INTRAVENOUS | Status: AC
  Administered 2021-08-28: 1000 mL via INTRAVENOUS

## 2021-08-28 MED ORDER — OXYCODONE HCL 5 MG PO TABS
5.0000 mg | ORAL_TABLET | ORAL | Status: DC | PRN
  Administered 2021-08-28: 10 mg via ORAL
  Administered 2021-08-28 (×2): 5 mg via ORAL
  Administered 2021-08-29 – 2021-09-08 (×36): 10 mg via ORAL
  Filled 2021-08-28 (×14): qty 2
  Filled 2021-08-28 (×2): qty 1
  Filled 2021-08-28 (×23): qty 2

## 2021-08-28 MED ORDER — HYDROMORPHONE HCL 1 MG/ML IJ SOLN
1.0000 mg | INTRAMUSCULAR | Status: DC | PRN
  Administered 2021-08-29 – 2021-09-01 (×8): 1 mg via INTRAVENOUS
  Filled 2021-08-28 (×8): qty 1

## 2021-08-28 MED ORDER — METHOCARBAMOL 500 MG PO TABS
1000.0000 mg | ORAL_TABLET | Freq: Three times a day (TID) | ORAL | Status: DC
  Administered 2021-08-28 – 2021-09-08 (×34): 1000 mg via ORAL
  Filled 2021-08-28 (×34): qty 2

## 2021-08-28 NOTE — Progress Notes (Signed)
Pt is responded to volume replacement. BP 104/69 mmHg after 1,000 ml of LR bolus. HR 97-100. No acute distress. Pain is tolerated. Pt is able to rest well.   PORTABLE CHEST 1 VIEW STAT ORDER:   COMPARISON:  08/27/2021   FINDINGS: Unchanged position of pigtail catheter with tip projecting over the mediastinum. No residual right-sided pneumothorax. Soft tissue emphysema within the right supraclavicular fossa is again noted.   IMPRESSION: No residual right-sided pneumothorax. Unchanged position of right chest tube.  CBC result:   Latest Reference Range & Units Most Recent  WBC 4.0 - 10.5 K/uL 10.3 08/28/21 02:41  RBC 4.22 - 5.81 MIL/uL 2.98 (L) 08/28/21 02:41  Hemoglobin 13.0 - 17.0 g/dL 9.9 (L) 06/26/01 47:42  HCT 39.0 - 52.0 % 28.6 (L) 08/28/21 02:41  MCV 80.0 - 100.0 fL 96.0 08/28/21 02:41  MCH 26.0 - 34.0 pg 33.2 08/28/21 02:41  MCHC 30.0 - 36.0 g/dL 59.5 08/25/85 56:43  RDW 11.5 - 15.5 % 14.4 08/28/21 02:41  Platelets 150 - 400 K/uL 114 (L) 08/28/21 02:41  nRBC 0.0 - 0.2 % 0.0 08/28/21 02:41    We will continue to monitor.  Filiberto Pinks, RN

## 2021-08-28 NOTE — Progress Notes (Signed)
Received in report that patient had small clot in chest tube tubing. Informed PA at the bedside.  Justin Bauer
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
## 2021-08-28 NOTE — Progress Notes (Signed)
Orthopedic Tech Progress Note Patient Details:  Justin Bauer December 16, 1960 [id number]  Ortho Devices Type of Ortho Device: Soft collar Ortho Device/Splint Location: NECK Ortho Device/Splint Interventions: Ordered   Post Interventions Patient Tolerated: Well Instructions Provided: Care of device  [name] 08/28/2021, 1:51 PM

## 2021-08-28 NOTE — Progress Notes (Signed)
Mobility Specialist: Progress Note   08/28/21 1530  Mobility  Activity Turned to right side;Turned to left side;Turned to back - supine  Level of Assistance +2 (takes two people) Presenter, broadcasting)  Assistive Device None  Activity Response Tolerated well  $Mobility charge 1 Mobility   Assisted RN and NT with log roll as well as soft cervical collar and sling placement. Pt had no c/o throughout. RN and NT present in the room at end of session.   [facility] Justin Bauer Mobility Specialist Mobility Specialist 5 North: [phone] Mobility Specialist 6 North: [phone]

## 2021-08-28 NOTE — Progress Notes (Signed)
Pt was unable to urinate with bladder distension. Bladder scanning found 446 ml of urine. Intermittent straight urine catheter size 16 Fr was inserted without any difficulties. Got total urine output 460 ml, amber color urine.  Pt reported no sensation of urging to urinate. We will hand off to the morning round for plan of care,concerning of inability to urinate.We will monitor.  Filiberto Pinks, RN

## 2021-08-28 NOTE — Progress Notes (Signed)
Patient ID: Justin Bauer, male   DOB: [birth year], 60 y.o.   MRN: [id number] BP 115/84   Pulse 100   Temp 98.5 F (36.9 C) (Oral)   Resp 18   Ht 6\' 3"  (1.905 m)   Wt 96.2 kg   SpO2 98%   BMI 26.50 kg/m  Alert and oriented x 4 Speech is clear and fluent Moving all extremities Mild weakness right upper extremity Waiting to get up on day 5 Doing well overall
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
## 2021-08-28 NOTE — Care Management (Signed)
Notified by bedside nurse that patient/family are interested in transferring to the [place] for care.  Spoke with patient's daughter, Sherron Ales [phone]); she states that they have changed their mind about this, but do have concerns about patient care on [address] [place].  Notified Water quality scientist and charge nurse, who state they will round on patient and discuss their concerns.    Quintella Baton, RN, BSN  Trauma/Neuro ICU Case Manager [phone]

## 2021-08-29 ENCOUNTER — Inpatient Hospital Stay (HOSPITAL_COMMUNITY): Payer: No Typology Code available for payment source

## 2021-08-29 ENCOUNTER — Encounter (HOSPITAL_COMMUNITY): Payer: Self-pay

## 2021-08-29 LAB — CBC
HCT: 24.6 % — ABNORMAL LOW (ref 39.0–52.0)
Hemoglobin: 8.3 g/dL — ABNORMAL LOW (ref 13.0–17.0)
MCH: 32.4 pg (ref 26.0–34.0)
MCHC: 33.7 g/dL (ref 30.0–36.0)
MCV: 96.1 fL (ref 80.0–100.0)
Platelets: 94 10*3/uL — ABNORMAL LOW (ref 150–400)
RBC: 2.56 MIL/uL — ABNORMAL LOW (ref 4.22–5.81)
RDW: 14 % (ref 11.5–15.5)
WBC: 10.2 10*3/uL (ref 4.0–10.5)
nRBC: 0 % (ref 0.0–0.2)

## 2021-08-29 LAB — BASIC METABOLIC PANEL
Anion gap: 6 (ref 5–15)
BUN: 15 mg/dL (ref 8–23)
CO2: 27 mmol/L (ref 22–32)
Calcium: 8 mg/dL — ABNORMAL LOW (ref 8.9–10.3)
Chloride: 98 mmol/L (ref 98–111)
Creatinine, Ser: 0.93 mg/dL (ref 0.61–1.24)
GFR, Estimated: >60 mL/min (ref >60)
Glucose, Bld: 122 mg/dL — ABNORMAL HIGH (ref 70–99)
Potassium: 4.3 mmol/L (ref 3.5–5.1)
Sodium: 131 mmol/L — ABNORMAL LOW (ref 135–145)

## 2021-08-29 MED ORDER — CHLORHEXIDINE GLUCONATE CLOTH 2 % EX PADS
6.0000 | MEDICATED_PAD | Freq: Every day | CUTANEOUS | Status: DC
  Administered 2021-08-29 – 2021-09-07 (×9): 6 via TOPICAL

## 2021-08-29 MED ORDER — POLYETHYLENE GLYCOL 3350 17 G PO PACK
17.0000 g | PACK | Freq: Two times a day (BID) | ORAL | Status: DC
  Administered 2021-08-29 – 2021-09-03 (×10): 17 g via ORAL
  Filled 2021-08-29 (×17): qty 1

## 2021-08-29 MED ORDER — SODIUM CHLORIDE 1 G PO TABS
1.0000 g | ORAL_TABLET | Freq: Three times a day (TID) | ORAL | Status: DC
  Administered 2021-08-29 – 2021-09-08 (×31): 1 g via ORAL
  Filled 2021-08-29 (×28): qty 1

## 2021-08-29 MED ORDER — BOOST / RESOURCE BREEZE PO LIQD CUSTOM
1.0000 | Freq: Three times a day (TID) | ORAL | Status: DC
  Administered 2021-08-29 – 2021-09-08 (×16): 1 via ORAL
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
## 2021-08-29 NOTE — TOC CAGE-AID Note (Signed)
Transition of Care [facility]) - CAGE-AID Screening   Patient Details  Name: Ewart Carrera MRN: [id number] Date of Birth: [birth year]  Transition of Care [facility]) CM/SW Contact:    Delbert Vu C Tarpley-Carter, LCSWA Phone Number: 08/29/2021, 12:15 PM   Clinical Narrative: Pt participated in Cage-Aid.  Pt stated he does use substance and ETOH.  Pt was offered resources, due to usage of substance and ETOH.     Kylii Ennis Tarpley-Carter, MSW, LCSW-A Pronouns:  She/Her/Hers [facility] HealthTransitions of Care Clinical Social Worker Direct Number:  [phone] [email or web address]   CAGE-AID Screening:    Have You Ever Felt You Ought to Cut Down on Your Drinking or Drug Use?: Yes Have People Annoyed You By Critizing Your Drinking Or Drug Use?: No Have You Felt Bad Or Guilty About Your Drinking Or Drug Use?: No Have You Ever Had a Drink or Used Drugs First Thing In The Morning to Steady Your Nerves or to Get Rid of a Hangover?: No CAGE-AID Score: 1  Substance Abuse Education Offered: Yes  Substance abuse interventions: Transport planner

## 2021-08-29 NOTE — Progress Notes (Signed)
RT instructed patient on the use of a flutter valve. Patient able to demonstrate back good technique. Patient has a strong congested cough.

## 2021-08-29 NOTE — Progress Notes (Signed)
Orthopedic Tech Progress Note Patient Details:  Justin Bauer 1960/09/11 [id number]  Called in order to [employer] for a [place]    Patient ID: Justin Bauer, male   DOB: September 23, 1960, 61 y.o.   MRN: [id number]  [name] 08/29/2021, 4:05 PM
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
## 2021-08-29 NOTE — Progress Notes (Signed)
Patient ID: Justin Bauer, male   DOB: 1961/03/08, 61 y.o.   MRN: [id number] BP 115/62   Pulse (!) 102   Temp 98.7 F (37.1 C) (Oral)   Resp 20   Ht 6\' 3"  (1.905 m)   Wt 96.2 kg   SpO2 93%   BMI 26.50 kg/m  Alert and oriented x 4 Speech is clear and fluent Moving all extremities Up on Saturday, in brace. Will need upright and supine films. Films should be performed while wearing the brace.

## 2021-08-29 NOTE — Progress Notes (Signed)
OT Cancellation Note  Patient Details Name: Justin Bauer MRN: [id number] DOB: [birth year]   Cancelled Treatment:    Reason Eval/Treat Not Completed: Active bedrest order  Lynnda Child, OTD, OTR/L Acute Rehab [phone] - [place] 08/29/2021, 2:03 PM
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
## 2021-08-30 ENCOUNTER — Inpatient Hospital Stay (HOSPITAL_COMMUNITY): Payer: No Typology Code available for payment source

## 2021-08-30 LAB — CBC
HCT: 22.5 % — ABNORMAL LOW (ref 39.0–52.0)
Hemoglobin: 8.1 g/dL — ABNORMAL LOW (ref 13.0–17.0)
MCH: 32.5 pg (ref 26.0–34.0)
MCHC: 36 g/dL (ref 30.0–36.0)
MCV: 90.4 fL (ref 80.0–100.0)
Platelets: 91 10*3/uL — ABNORMAL LOW (ref 150–400)
RBC: 2.49 MIL/uL — ABNORMAL LOW (ref 4.22–5.81)
RDW: 13.5 % (ref 11.5–15.5)
WBC: 12.4 10*3/uL — ABNORMAL HIGH (ref 4.0–10.5)
nRBC: 0 % (ref 0.0–0.2)

## 2021-08-30 LAB — BASIC METABOLIC PANEL
Anion gap: 7 (ref 5–15)
BUN: 11 mg/dL (ref 8–23)
CO2: 26 mmol/L (ref 22–32)
Calcium: 8 mg/dL — ABNORMAL LOW (ref 8.9–10.3)
Chloride: 101 mmol/L (ref 98–111)
Creatinine, Ser: 0.81 mg/dL (ref 0.61–1.24)
GFR, Estimated: >60 mL/min (ref >60)
Glucose, Bld: 143 mg/dL — ABNORMAL HIGH (ref 70–99)
Potassium: 4.2 mmol/L (ref 3.5–5.1)
Sodium: 134 mmol/L — ABNORMAL LOW (ref 135–145)

## 2021-08-30 MED ORDER — GABAPENTIN 400 MG PO CAPS
400.0000 mg | ORAL_CAPSULE | Freq: Three times a day (TID) | ORAL | Status: DC
  Administered 2021-08-30 – 2021-09-08 (×27): 400 mg via ORAL
  Filled 2021-08-30 (×28): qty 1

## 2021-08-30 NOTE — Progress Notes (Signed)
Pt transferred to 4NP10 from 4E. Belongings and TLSO at bedside.  Justice Rocher, RN
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
## 2021-08-30 NOTE — TOC Progression Note (Signed)
Transition of Care [facility]) - Progression Note    Patient Details  Name: Justin Bauer MRN: [id number] Date of Birth: 1960/08/21  Transition of Care [facility]) CM/SW Contact  Glennon Mac, RN Phone Number: 08/30/2021, 3:45 PM  Clinical Narrative:    Patient remains on bedrest; unable to work with therapies until Saturday.  TOC will continue to follow for PT/OT recommendations when patient able to tolerate/per MD orders.   Expected Discharge Plan: Home w Home Health Services Barriers to Discharge: Continued Medical Work up  Expected Discharge Plan and Services Expected Discharge Plan: Home w Home Health Services   Discharge Planning Services: CM Consult   Living arrangements for the past 2 months: Single Family Home                                       Social Determinants of Health (SDOH) Interventions    Readmission Risk Interventions     No data to display         Quintella Baton, RN, BSN  Trauma/Neuro ICU Case Manager [phone]

## 2021-08-30 NOTE — Progress Notes (Signed)
Patient transferred to 4n-10, left VM with sister Aggie Cosier
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
## 2021-08-31 ENCOUNTER — Inpatient Hospital Stay (HOSPITAL_COMMUNITY): Payer: No Typology Code available for payment source

## 2021-08-31 LAB — BASIC METABOLIC PANEL
Anion gap: 7 (ref 5–15)
BUN: 9 mg/dL (ref 8–23)
CO2: 29 mmol/L (ref 22–32)
Calcium: 8 mg/dL — ABNORMAL LOW (ref 8.9–10.3)
Chloride: 100 mmol/L (ref 98–111)
Creatinine, Ser: 0.75 mg/dL (ref 0.61–1.24)
GFR, Estimated: >60 mL/min (ref >60)
Glucose, Bld: 108 mg/dL — ABNORMAL HIGH (ref 70–99)
Potassium: 4.4 mmol/L (ref 3.5–5.1)
Sodium: 136 mmol/L (ref 135–145)

## 2021-08-31 LAB — CBC
HCT: 23.1 % — ABNORMAL LOW (ref 39.0–52.0)
Hemoglobin: 7.7 g/dL — ABNORMAL LOW (ref 13.0–17.0)
MCH: 31.8 pg (ref 26.0–34.0)
MCHC: 33.3 g/dL (ref 30.0–36.0)
MCV: 95.5 fL (ref 80.0–100.0)
Platelets: 135 10*3/uL — ABNORMAL LOW (ref 150–400)
RBC: 2.42 MIL/uL — ABNORMAL LOW (ref 4.22–5.81)
RDW: 14.6 % (ref 11.5–15.5)
WBC: 12.2 10*3/uL — ABNORMAL HIGH (ref 4.0–10.5)
nRBC: 0 % (ref 0.0–0.2)

## 2021-08-31 MED ORDER — ENOXAPARIN SODIUM 40 MG/0.4ML IJ SOSY
40.0000 mg | PREFILLED_SYRINGE | Freq: Two times a day (BID) | INTRAMUSCULAR | Status: DC
  Administered 2021-08-31 – 2021-09-08 (×17): 40 mg via SUBCUTANEOUS
  Filled 2021-08-31 (×17): qty 0.4
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
## 2021-09-01 ENCOUNTER — Inpatient Hospital Stay (HOSPITAL_COMMUNITY): Payer: No Typology Code available for payment source

## 2021-09-01 LAB — CBC
HCT: 22.7 % — ABNORMAL LOW (ref 39.0–52.0)
Hemoglobin: 7.7 g/dL — ABNORMAL LOW (ref 13.0–17.0)
MCH: 32.1 pg (ref 26.0–34.0)
MCHC: 33.9 g/dL (ref 30.0–36.0)
MCV: 94.6 fL (ref 80.0–100.0)
Platelets: 191 10*3/uL (ref 150–400)
RBC: 2.4 MIL/uL — ABNORMAL LOW (ref 4.22–5.81)
RDW: 14.4 % (ref 11.5–15.5)
WBC: 12.6 10*3/uL — ABNORMAL HIGH (ref 4.0–10.5)
nRBC: 0 % (ref 0.0–0.2)

## 2021-09-01 LAB — BASIC METABOLIC PANEL
Anion gap: 8 (ref 5–15)
BUN: 9 mg/dL (ref 8–23)
CO2: 26 mmol/L (ref 22–32)
Calcium: 7.6 mg/dL — ABNORMAL LOW (ref 8.9–10.3)
Chloride: 98 mmol/L (ref 98–111)
Creatinine, Ser: 0.69 mg/dL (ref 0.61–1.24)
GFR, Estimated: >60 mL/min (ref >60)
Glucose, Bld: 107 mg/dL — ABNORMAL HIGH (ref 70–99)
Potassium: 4.1 mmol/L (ref 3.5–5.1)
Sodium: 132 mmol/L — ABNORMAL LOW (ref 135–145)

## 2021-09-01 MED ORDER — FUROSEMIDE 10 MG/ML IJ SOLN
20.0000 mg | Freq: Once | INTRAMUSCULAR | Status: AC
  Administered 2021-09-01: 20 mg via INTRAVENOUS
  Filled 2021-09-01: qty 2
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
## 2021-09-01 NOTE — Progress Notes (Signed)
OT Cancellation Note  Patient Details Name: Evie Wiltsie MRN: [id number] DOB: 06/05/1960   Cancelled Treatment:    Reason Eval/Treat Not Completed: Active bedrest order (pending new films per 6/10 Megan notes)  Jeri Modena 09/01/2021, 11:28 AM

## 2021-09-01 NOTE — Progress Notes (Signed)
Patient ID: Justin Bauer, male   DOB: 07-05-1960, 61 y.o.   MRN: [id number] [facility] Surgery Progress Note:   * No surgery found *  Subjective: Mental status is alert.  Complaints pain with inspiration. Objective: Vital signs in last 24 hours: Temp:  [98.3 F (36.8 C)-100.8 F (38.2 C)] 98.4 F (36.9 C) (06/10 0329) Pulse Rate:  [80-110] 93 (06/10 0329) Resp:  [18-24] 18 (06/10 0329) BP: (109-121)/(65-74) 119/74 (06/10 0329) SpO2:  [92 %-93 %] 93 % (06/10 0329)  Intake/Output from previous day: 06/09 0701 - 06/10 0700 In: 360 [P.O.:360] Out: 2851 [Urine:2750; Stool:1; Chest Tube:100] Intake/Output this shift: No intake/output data recorded.  Physical Exam: Work of breathing is not labored;  BS are equal bilaterally, CT on the right (pigtail) to water seal.    Lab Results:  Results for orders placed or performed during the hospital encounter of 08/26/21 (from the past 48 hour(s))  CBC     Status: Abnormal   Collection Time: 08/31/21  6:20 AM  Result Value Ref Range   WBC 12.2 (H) 4.0 - 10.5 K/uL   RBC 2.42 (L) 4.22 - 5.81 MIL/uL   Hemoglobin 7.7 (L) 13.0 - 17.0 g/dL   HCT 23.1 (L) 39.0 - 52.0 %   MCV 95.5 80.0 - 100.0 fL   MCH 31.8 26.0 - 34.0 pg   MCHC 33.3 30.0 - 36.0 g/dL   RDW 14.6 11.5 - 15.5 %   Platelets 135 (L) 150 - 400 K/uL    Comment: REPEATED TO VERIFY   nRBC 0.0 0.0 - 0.2 %    Comment: Performed at [facility], [place] [address], [place], [place] [id number]  Basic metabolic panel     Status: Abnormal   Collection Time: 08/31/21  6:20 AM  Result Value Ref Range   Sodium 136 135 - 145 mmol/L   Potassium 4.4 3.5 - 5.1 mmol/L   Chloride 100 98 - 111 mmol/L   CO2 29 22 - 32 mmol/L   Glucose, Bld 108 (H) 70 - 99 mg/dL    Comment: Glucose reference range applies only to samples taken after fasting for at least 8 hours.   BUN 9 8 - 23 mg/dL   Creatinine, Ser 0.75 0.61 - 1.24 mg/dL   Calcium 8.0 (L) 8.9 - 10.3 mg/dL   GFR, Estimated >60 >60 mL/min     Comment: (NOTE) Calculated using the CKD-EPI Creatinine Equation (2021)    Anion gap 7 5 - 15    Comment: Performed at [facility] [address] [place], [place] [address]  CBC     Status: Abnormal   Collection Time: 09/01/21  3:02 AM  Result Value Ref Range   WBC 12.6 (H) 4.0 - 10.5 K/uL   RBC 2.40 (L) 4.22 - 5.81 MIL/uL   Hemoglobin 7.7 (L) 13.0 - 17.0 g/dL   HCT 22.7 (L) 39.0 - 52.0 %   MCV 94.6 80.0 - 100.0 fL   MCH 32.1 26.0 - 34.0 pg   MCHC 33.9 30.0 - 36.0 g/dL   RDW 14.4 11.5 - 15.5 %   Platelets 191 150 - 400 K/uL   nRBC 0.0 0.0 - 0.2 %    Comment: Performed at [facility], [place] [address], [place], [place] [id number]  Basic metabolic panel     Status: Abnormal   Collection Time: 09/01/21  3:02 AM  Result Value Ref Range   Sodium 132 (L) 135 - 145 mmol/L  Potassium 4.1 3.5 - 5.1 mmol/L   Chloride 98 98 - 111 mmol/L   CO2 26 22 - 32 mmol/L   Glucose, Bld 107 (H) 70 - 99 mg/dL    Comment: Glucose reference range applies only to samples taken after fasting for at least 8 hours.   BUN 9 8 - 23 mg/dL   Creatinine, Ser 0.69 0.61 - 1.24 mg/dL   Calcium 7.6 (L) 8.9 - 10.3 mg/dL   GFR, Estimated >60 >60 mL/min    Comment: (NOTE) Calculated using the CKD-EPI Creatinine Equation (2021)    Anion gap 8 5 - 15    Comment: Performed at [facility] [address] [place], [place] [address]    Radiology/Results: DG Cervical Spine 2 or 3 views  Result Date: 08/31/2021 CLINICAL DATA:  Level 2 trauma.  Inpatient. EXAM: CERVICAL SPINE - 2-3 VIEW COMPARISON:  MRI cervical spine 08/27/2021. CT cervical spine 08/26/2021 FINDINGS: No evidence for acute fracture or subluxation. Degenerative changes noted diffusely in the cervical spine, most advanced at C6-7 with endplate degeneration. No prevertebral soft tissue swelling. IMPRESSION: Degenerative changes without acute bony abnormality. Electronically Signed   By: Misty Stanley M.D.   On: 08/31/2021 14:24   DG  CHEST PORT 1 VIEW  Result Date: 08/31/2021 CLINICAL DATA:  Chest tube. EXAM: PORTABLE CHEST 1 VIEW COMPARISON:  Radiograph August 30, 2021 FINDINGS: Similar position of the right pigtail chest tube. No significant interval change in the residual moderate right pleural effusion and right basilar atelectasis/consolidation. No significant pneumothorax. Cardiomediastinal silhouette is unchanged. Similar bilateral interstitial opacities. Right third rib fracture. IMPRESSION: Stable right chest tube with persistent moderate right-sided pleural effusion and adjacent atelectasis/consolidation. Similar bilateral interstitial opacities which may reflect pulmonary edema. Electronically Signed   By: Dahlia Bailiff M.D.   On: 08/31/2021 08:17    Anti-infectives: Anti-infectives (From admission, onward)    None       Assessment/Plan: Problem List: Patient Active Problem List   Diagnosis Date Noted   Pneumothorax 08/26/2021    Scheduled to get out of bed but xrays were taken incorrectly and corrected films are pending.   * No surgery found *    LOS: 6 days   Matt B. Hassell Done, MD, [facility] [phone] to reach the surgeon on call.    09/01/2021 9:55 AM

## 2021-09-02 ENCOUNTER — Inpatient Hospital Stay (HOSPITAL_COMMUNITY): Payer: No Typology Code available for payment source

## 2021-09-02 LAB — BASIC METABOLIC PANEL
Anion gap: 7 (ref 5–15)
BUN: 14 mg/dL (ref 8–23)
CO2: 29 mmol/L (ref 22–32)
Calcium: 8 mg/dL — ABNORMAL LOW (ref 8.9–10.3)
Chloride: 99 mmol/L (ref 98–111)
Creatinine, Ser: 0.71 mg/dL (ref 0.61–1.24)
GFR, Estimated: >60 mL/min (ref >60)
Glucose, Bld: 147 mg/dL — ABNORMAL HIGH (ref 70–99)
Potassium: 4.1 mmol/L (ref 3.5–5.1)
Sodium: 135 mmol/L (ref 135–145)

## 2021-09-02 LAB — CBC
HCT: 23.1 % — ABNORMAL LOW (ref 39.0–52.0)
Hemoglobin: 7.9 g/dL — ABNORMAL LOW (ref 13.0–17.0)
MCH: 32.4 pg (ref 26.0–34.0)
MCHC: 34.2 g/dL (ref 30.0–36.0)
MCV: 94.7 fL (ref 80.0–100.0)
Platelets: 254 10*3/uL (ref 150–400)
RBC: 2.44 MIL/uL — ABNORMAL LOW (ref 4.22–5.81)
RDW: 14.5 % (ref 11.5–15.5)
WBC: 12.3 10*3/uL — ABNORMAL HIGH (ref 4.0–10.5)
nRBC: 0 % (ref 0.0–0.2)

## 2021-09-02 NOTE — Progress Notes (Signed)
Inpatient Rehab Admissions Coordinator:  ? ?Per therapy recommendations,  patient was screened for CIR candidacy by Keimari , MS, CCC-SLP. At this time, Pt. Appears to be a a potential candidate for CIR. I will place   order for rehab consult per protocol for full assessment. Please contact me any with questions. ? ?Rosamary Boudreau, MS, CCC-SLP ?Rehab Admissions Coordinator  ?[phone] (celll) ?[phone] (office) ? ?
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
## 2021-09-03 ENCOUNTER — Inpatient Hospital Stay (HOSPITAL_COMMUNITY): Payer: No Typology Code available for payment source
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
## 2021-09-03 NOTE — Progress Notes (Signed)
Inpatient Rehab Admissions Coordinator:   Met with pt and his sister at the bedside to discuss CIR recommendations and goals/expectations of CIR stay.  We reviewed 3 hrs/day of therapy, physician follow up, and average length of stay 2 weeks (depending on progress) with goals of 24/7 supervision at discharge.  Pt and his sister both prefer longer term rehab at SNF to allow for maximal recovery prior to returning home.  I will let case manager know and sign off for CIR.    Shann Medal, PT, DPT Admissions Coordinator [phone] 09/03/21  1:46 PM
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
## 2021-09-04 ENCOUNTER — Inpatient Hospital Stay (HOSPITAL_COMMUNITY): Payer: No Typology Code available for payment source

## 2021-09-04 LAB — CBC
HCT: 24.5 % — ABNORMAL LOW (ref 39.0–52.0)
Hemoglobin: 8.2 g/dL — ABNORMAL LOW (ref 13.0–17.0)
MCH: 32 pg (ref 26.0–34.0)
MCHC: 33.5 g/dL (ref 30.0–36.0)
MCV: 95.7 fL (ref 80.0–100.0)
Platelets: 434 10*3/uL — ABNORMAL HIGH (ref 150–400)
RBC: 2.56 MIL/uL — ABNORMAL LOW (ref 4.22–5.81)
RDW: 14.4 % (ref 11.5–15.5)
WBC: 13.8 10*3/uL — ABNORMAL HIGH (ref 4.0–10.5)
nRBC: 0 % (ref 0.0–0.2)

## 2021-09-04 LAB — BASIC METABOLIC PANEL
Anion gap: 8 (ref 5–15)
BUN: 13 mg/dL (ref 8–23)
CO2: 25 mmol/L (ref 22–32)
Calcium: 8 mg/dL — ABNORMAL LOW (ref 8.9–10.3)
Chloride: 104 mmol/L (ref 98–111)
Creatinine, Ser: 0.62 mg/dL (ref 0.61–1.24)
GFR, Estimated: >60 mL/min (ref >60)
Glucose, Bld: 106 mg/dL — ABNORMAL HIGH (ref 70–99)
Potassium: 4.4 mmol/L (ref 3.5–5.1)
Sodium: 137 mmol/L (ref 135–145)

## 2021-09-04 LAB — EXPECTORATED SPUTUM ASSESSMENT W GRAM STAIN, RFLX TO RESP C

## 2021-09-04 MED ORDER — SODIUM CHLORIDE 0.9 % IV SOLN
2.0000 g | Freq: Three times a day (TID) | INTRAVENOUS | Status: DC
  Administered 2021-09-04 – 2021-09-08 (×12): 2 g via INTRAVENOUS
  Filled 2021-09-04 (×12): qty 12.5
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
## 2021-09-04 NOTE — TOC Progression Note (Signed)
Transition of Care [facility]) - Progression Note    Patient Details  Name: Justin Bauer MRN: [id number] Date of Birth: Jan 25, 1961  [facility] CM/SW Contact  Ella Bodo, RN Phone Number: 09/04/2021, 12:10pm  Clinical Narrative:    Met with patient to discuss discharge plans; CIR recommended, but patient prefers skilled nursing facility placement for rehab.  Patient states that he lives alone, and will need more time to recover, so prefers SNF.  Will initiate FL 2 and fax out for bed search.  Will provide bed offers to patient when available.   Expected Discharge Plan: [place] Barriers to Discharge: Continued Medical Work up  Expected Discharge Plan and Services Expected Discharge Plan: [place]   Discharge Planning Services: CM Consult   Living arrangements for the past 2 months: Single Family Home                                       Social Determinants of Health (SDOH) Interventions    Readmission Risk Interventions     No data to display         Justin Raddle, RN, BSN  Trauma/Neuro ICU Case Manager [phone]

## 2021-09-04 NOTE — Progress Notes (Signed)
Lab called stating sputum sample collected on previous shift did not meet requirements and to recollect. This RN will try to collect another sample.

## 2021-09-04 NOTE — NC FL2 (Signed)
[place] MEDICAID FL2 LEVEL OF CARE SCREENING TOOL     IDENTIFICATION  Patient Name: Justin Bauer Birthdate: 03/17/1961 Sex: male Admission Date (Current Location): 08/26/2021  [facility] and [place] Number:  Producer, television/film/video and Address:         Provider Number: [id number]  Attending Physician Name and Address:  Md, Trauma, MD  Relative Name and Phone Number:  Chibuike Fleek, sister  phone: [phone]    Current Level of Care: Hospital Recommended Level of Care: Skilled Nursing Facility Prior Approval Number:    Date Approved/Denied:   PASRR Number: [id number]  Discharge Plan: SNF    Current Diagnoses: Patient Active Problem List   Diagnosis Date Noted   Pneumothorax 08/26/2021    Orientation RESPIRATION BLADDER Height & Weight     Self, Time, Situation, Place  O2 (3L/ [place]) Continent Weight: 96.2 kg Height:  6\' 3"  (190.5 cm)  BEHAVIORAL SYMPTOMS/MOOD NEUROLOGICAL BOWEL NUTRITION STATUS      Continent Diet (Regular, thin liquids)  AMBULATORY STATUS COMMUNICATION OF NEEDS Skin   Limited Assist Verbally Normal                       Personal Care Assistance Level of Assistance  Bathing, Feeding, Dressing Bathing Assistance: Limited assistance Feeding assistance: Independent Dressing Assistance: Limited assistance     Functional Limitations Info             SPECIAL CARE FACTORS FREQUENCY  PT (By licensed PT), OT (By licensed OT)     PT Frequency: 5x weekly OT Frequency: 5x weekly            Contractures Contractures Info: Not present    Additional Factors Info  Code Status, Allergies Code Status Info: Full code Allergies Info: No known allergies           Current Medications (09/04/2021):  This is the current hospital active medication list Current Facility-Administered Medications  Medication Dose Route Frequency Provider Last Rate Last Admin   acetaminophen (TYLENOL) tablet 1,000 mg  1,000 mg Oral Q6H 09/06/2021 A, MD    1,000 mg at 09/04/21 0253   bisacodyl (DULCOLAX) suppository 10 mg  10 mg Rectal Daily PRN Meuth, Brooke A, PA-C       calcium carbonate (TUMS - dosed in mg elemental calcium) chewable tablet 200 mg of elemental calcium  1 tablet Oral TID WC Meuth, Brooke A, PA-C   200 mg of elemental calcium at 09/04/21 1037   ceFEPIme (MAXIPIME) 2 g in sodium chloride 0.9 % 100 mL IVPB  2 g Intravenous Q8H von Dohlen, Haley B, RPH       Chlorhexidine Gluconate Cloth 2 % PADS 6 each  6 each Topical Daily 09/06/21, MD   6 each at 09/04/21 1038   docusate sodium (COLACE) capsule 100 mg  100 mg Oral BID Kinsinger, 09/06/21, MD   100 mg at 09/04/21 1037   enoxaparin (LOVENOX) injection 40 mg  40 mg Subcutaneous Q12H Meuth, Brooke A, PA-C   40 mg at 09/04/21 1425   feeding supplement (BOOST / RESOURCE BREEZE) liquid 1 Container  1 Container Oral TID BM Meuth, Brooke A, PA-C   1 Container at 09/04/21 1425   gabapentin (NEURONTIN) capsule 400 mg  400 mg Oral TID Meuth, Brooke A, PA-C   400 mg at 09/04/21 1037   guaiFENesin (MUCINEX) 12 hr tablet 600 mg  600 mg Oral BID Meuth, Brooke A, PA-C   600 mg at  09/04/21 1037   hydrALAZINE (APRESOLINE) injection 10 mg  10 mg Intravenous Q2H PRN Kinsinger, De Blanch, MD       HYDROmorphone (DILAUDID) injection 1 mg  1 mg Intravenous Q3H PRN Meuth, Brooke A, PA-C   1 mg at 09/01/21 1038   lactated ringers infusion   Intravenous Continuous Meuth, Brooke A, PA-C   Stopped at 08/30/21 1642   methocarbamol (ROBAXIN) tablet 1,000 mg  1,000 mg Oral Q8H Meuth, Brooke A, PA-C   1,000 mg at 09/04/21 1424   metoprolol tartrate (LOPRESSOR) injection 5 mg  5 mg Intravenous Q6H PRN Kinsinger, De Blanch, MD       ondansetron (ZOFRAN-ODT) disintegrating tablet 4 mg  4 mg Oral Q6H PRN Kinsinger, De Blanch, MD       Or   ondansetron [facility]) injection 4 mg  4 mg Intravenous Q6H PRN Kinsinger, De Blanch, MD   4 mg at 08/26/21 1830   oxyCODONE (Oxy IR/ROXICODONE) immediate release tablet  5-10 mg  5-10 mg Oral Q4H PRN Meuth, Brooke A, PA-C   10 mg at 09/04/21 1037   polyethylene glycol (MIRALAX / GLYCOLAX) packet 17 g  17 g Oral BID Meuth, Brooke A, PA-C   17 g at 09/03/21 1035   sodium chloride tablet 1 g  1 g Oral TID WC Meuth, Brooke A, PA-C   1 g at 09/04/21 1425   tamsulosin (FLOMAX) capsule 0.4 mg  0.4 mg Oral Daily Diamantina Monks, MD   0.4 mg at 09/04/21 1037     Discharge Medications: Please see discharge summary for a list of discharge medications.  Relevant Imaging Results:  Relevant Lab Results:   Additional Information SS # [id number]  Quintella Baton, RN, BSN  Trauma/Neuro ICU Case Manager [phone]
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
## 2021-09-05 ENCOUNTER — Inpatient Hospital Stay (HOSPITAL_COMMUNITY): Payer: No Typology Code available for payment source

## 2021-09-05 MED ORDER — FUROSEMIDE 10 MG/ML IJ SOLN
20.0000 mg | Freq: Once | INTRAMUSCULAR | Status: AC
  Administered 2021-09-05: 20 mg via INTRAVENOUS
  Filled 2021-09-05: qty 2

## 2021-09-05 MED ORDER — LIDOCAINE 5 % EX PTCH
1.0000 | MEDICATED_PATCH | CUTANEOUS | Status: DC
  Administered 2021-09-05 – 2021-09-08 (×4): 1 via TRANSDERMAL
  Filled 2021-09-05 (×4): qty 1

## 2021-09-05 NOTE — Progress Notes (Signed)
Patient ID: Justin Bauer, male   DOB: December 10, 1960, 61 y.o.   MRN: [id number] BP 112/67 (BP Location: Left Arm)   Pulse (!) 103   Temp 98.5 F (36.9 C) (Oral)   Resp (!) 23   Ht 6\' 3"  (1.905 m)   Wt 96.2 kg   SpO2 94%   BMI 26.50 kg/m  Alert and oriented x 4, speech is clear, fluent Moving all extremities, mild weakness left lower extremity Chest tube out and he feels better.  OT recommended inpatient rehab.

## 2021-09-05 NOTE — Progress Notes (Signed)
RN has given patient the sputum sample tube. Thus far patient has not produced a sputum sample for this RN to collect on this shift. Patient stated that he does not need to cough up and sputum. RN updated day shift RN of this.
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
## 2021-09-05 NOTE — TOC Progression Note (Signed)
Transition of Care [facility]) - Progression Note    Patient Details  Name: Fredick Weaks MRN: [id number] Date of Birth: May 17, 1960  [facility] of [facility] CM/SW Contact  Ella Bodo, RN Phone Number: 09/05/2021, 2:43 PM  Clinical Narrative:    Patient has one bed offer for SNF; spoke with patient and informed him of current bed offer.  Will initiate VA authorization; faxed [place] screening and clinical information to the [place] at [phone].  Will await VA approval for contracted SNF. Patient made aware that he may be medically ready for discharge by Wednesday.  Will follow with updates as available.   Expected Discharge Plan: Industry Barriers to Discharge: Continued Medical Work up  Expected Discharge Plan and Services Expected Discharge Plan: [place]   Discharge Planning Services: CM Consult   Living arrangements for the past 2 months: Single Family Home                                       Social Determinants of Health (SDOH) Interventions    Readmission Risk Interventions     No data to display         Reinaldo Raddle, RN, BSN  Trauma/Neuro ICU Case Manager [phone]
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
## 2021-09-06 LAB — GLUCOSE, CAPILLARY: Glucose-Capillary: 100 mg/dL — ABNORMAL HIGH (ref 70–99)

## 2021-09-06 LAB — CBC
HCT: 26.2 % — ABNORMAL LOW (ref 39.0–52.0)
Hemoglobin: 8.7 g/dL — ABNORMAL LOW (ref 13.0–17.0)
MCH: 31.5 pg (ref 26.0–34.0)
MCHC: 33.2 g/dL (ref 30.0–36.0)
MCV: 94.9 fL (ref 80.0–100.0)
Platelets: 641 10*3/uL — ABNORMAL HIGH (ref 150–400)
RBC: 2.76 MIL/uL — ABNORMAL LOW (ref 4.22–5.81)
RDW: 14.4 % (ref 11.5–15.5)
WBC: 12.4 10*3/uL — ABNORMAL HIGH (ref 4.0–10.5)
nRBC: 0 % (ref 0.0–0.2)

## 2021-09-06 LAB — BASIC METABOLIC PANEL
Anion gap: 7 (ref 5–15)
BUN: 20 mg/dL (ref 8–23)
CO2: 28 mmol/L (ref 22–32)
Calcium: 8.3 mg/dL — ABNORMAL LOW (ref 8.9–10.3)
Chloride: 102 mmol/L (ref 98–111)
Creatinine, Ser: 0.71 mg/dL (ref 0.61–1.24)
GFR, Estimated: >60 mL/min (ref >60)
Glucose, Bld: 96 mg/dL (ref 70–99)
Potassium: 4.7 mmol/L (ref 3.5–5.1)
Sodium: 137 mmol/L (ref 135–145)
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
## 2021-09-06 NOTE — Progress Notes (Addendum)
Inpatient Rehab Admissions Coordinator:   Met with patient at bedside to discuss CIR as he is now interested.  Plan to d/c to sister's home after rehab.  He asked whether there was an inpatient rehab at the [place] [facility] in [place], but was agreeable to begin insurance auth for [facility] CIR if not.  Spoke to Ellan Lambert, RN CM, and it does not appear there is an AIR in [facility], so will open insurance auth request today for [facility].   Shann Medal, PT, DPT Admissions Coordinator [phone] 09/06/21  12:28 PM
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
## 2021-09-07 NOTE — Plan of Care (Signed)
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
## 2021-09-07 NOTE — Progress Notes (Signed)
Inpatient Rehab Admissions Coordinator:   I have insurance approval and a bed for this pt to admit to CIR on Saturday (6/17).  Bailey Mech, PA-C, in agreement.  Rehab MD (Dr. Natale Lay) to assess pt and confirm admission on Saturday.  Floor RN can call CIR at [phone] for report after 12pm on Saturday.  I will let pt/family and case manager know.    Estill Dooms, PT, DPT Admissions Coordinator [phone] 09/07/21  10:55 AM
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
## 2021-09-08 ENCOUNTER — Inpatient Hospital Stay (HOSPITAL_COMMUNITY)
Admission: RE | Admit: 2021-09-08 | Discharge: 2021-09-19 | DRG: 560 | Disposition: A | Discharge status: Home / Self Care | Payer: No Typology Code available for payment source | Source: Intra-hospital | Attending: Physical Medicine and Rehabilitation | Admitting: Physical Medicine and Rehabilitation

## 2021-09-08 ENCOUNTER — Encounter (HOSPITAL_COMMUNITY): Payer: Self-pay | Admitting: Physical Medicine and Rehabilitation

## 2021-09-08 ENCOUNTER — Other Ambulatory Visit: Payer: Self-pay

## 2021-09-08 DIAGNOSIS — Z716 Tobacco abuse counseling: Secondary | ICD-10-CM

## 2021-09-08 DIAGNOSIS — Z7141 Alcohol abuse counseling and surveillance of alcoholic: Secondary | ICD-10-CM

## 2021-09-08 DIAGNOSIS — M109 Gout, unspecified: Secondary | ICD-10-CM | POA: Diagnosis not present

## 2021-09-08 DIAGNOSIS — R339 Retention of urine, unspecified: Secondary | ICD-10-CM | POA: Diagnosis not present

## 2021-09-08 DIAGNOSIS — K5903 Drug induced constipation: Secondary | ICD-10-CM

## 2021-09-08 DIAGNOSIS — S12600D Unspecified displaced fracture of seventh cervical vertebra, subsequent encounter for fracture with routine healing: Secondary | ICD-10-CM | POA: Diagnosis not present

## 2021-09-08 DIAGNOSIS — Y9355 Activity, bike riding: Secondary | ICD-10-CM | POA: Diagnosis not present

## 2021-09-08 DIAGNOSIS — S22059D Unspecified fracture of T5-T6 vertebra, subsequent encounter for fracture with routine healing: Secondary | ICD-10-CM | POA: Diagnosis not present

## 2021-09-08 DIAGNOSIS — S22058D Other fracture of T5-T6 vertebra, subsequent encounter for fracture with routine healing: Principal | ICD-10-CM

## 2021-09-08 DIAGNOSIS — S2241XD Multiple fractures of ribs, right side, subsequent encounter for fracture with routine healing: Secondary | ICD-10-CM | POA: Diagnosis not present

## 2021-09-08 DIAGNOSIS — Z602 Problems related to living alone: Secondary | ICD-10-CM | POA: Diagnosis present

## 2021-09-08 DIAGNOSIS — S270XXD Traumatic pneumothorax, subsequent encounter: Secondary | ICD-10-CM

## 2021-09-08 DIAGNOSIS — D62 Acute posthemorrhagic anemia: Secondary | ICD-10-CM | POA: Diagnosis present

## 2021-09-08 DIAGNOSIS — T07XXXA Unspecified multiple injuries, initial encounter: Secondary | ICD-10-CM | POA: Diagnosis present

## 2021-09-08 DIAGNOSIS — T797XXD Traumatic subcutaneous emphysema, subsequent encounter: Secondary | ICD-10-CM

## 2021-09-08 DIAGNOSIS — E871 Hypo-osmolality and hyponatremia: Secondary | ICD-10-CM | POA: Diagnosis present

## 2021-09-08 DIAGNOSIS — D72829 Elevated white blood cell count, unspecified: Secondary | ICD-10-CM | POA: Diagnosis not present

## 2021-09-08 DIAGNOSIS — K5900 Constipation, unspecified: Secondary | ICD-10-CM | POA: Diagnosis present

## 2021-09-08 DIAGNOSIS — S42031D Displaced fracture of lateral end of right clavicle, subsequent encounter for fracture with routine healing: Secondary | ICD-10-CM | POA: Diagnosis not present

## 2021-09-08 DIAGNOSIS — Z87891 Personal history of nicotine dependence: Secondary | ICD-10-CM

## 2021-09-08 DIAGNOSIS — M7989 Other specified soft tissue disorders: Secondary | ICD-10-CM | POA: Diagnosis not present

## 2021-09-08 MED ORDER — ACETAMINOPHEN 325 MG PO TABS
325.0000 mg | ORAL_TABLET | ORAL | Status: DC | PRN
  Administered 2021-09-12 – 2021-09-18 (×3): 650 mg via ORAL
  Filled 2021-09-08 (×3): qty 2

## 2021-09-08 MED ORDER — ENOXAPARIN SODIUM 40 MG/0.4ML IJ SOSY
40.0000 mg | PREFILLED_SYRINGE | Freq: Two times a day (BID) | INTRAMUSCULAR | Status: DC
  Administered 2021-09-09 – 2021-09-19 (×22): 40 mg via SUBCUTANEOUS
  Filled 2021-09-08 (×22): qty 0.4

## 2021-09-08 MED ORDER — DOCUSATE SODIUM 100 MG PO CAPS
100.0000 mg | ORAL_CAPSULE | Freq: Two times a day (BID) | ORAL | Status: DC
  Administered 2021-09-08 – 2021-09-18 (×21): 100 mg via ORAL
  Filled 2021-09-08 (×23): qty 1

## 2021-09-08 MED ORDER — GABAPENTIN 400 MG PO CAPS
400.0000 mg | ORAL_CAPSULE | Freq: Three times a day (TID) | ORAL | Status: DC
  Administered 2021-09-08 – 2021-09-10 (×5): 400 mg via ORAL
  Filled 2021-09-08 (×5): qty 1

## 2021-09-08 MED ORDER — SODIUM CHLORIDE 0.9 % IV SOLN
2.0000 g | Freq: Three times a day (TID) | INTRAVENOUS | Status: AC
  Administered 2021-09-08 – 2021-09-09 (×4): 2 g via INTRAVENOUS
  Filled 2021-09-08 (×5): qty 12.5

## 2021-09-08 MED ORDER — BOOST / RESOURCE BREEZE PO LIQD CUSTOM
1.0000 | Freq: Three times a day (TID) | ORAL | Status: DC
  Administered 2021-09-09 – 2021-09-11 (×3): 1 via ORAL

## 2021-09-08 MED ORDER — TAMSULOSIN HCL 0.4 MG PO CAPS: 0.4000 mg | ORAL_CAPSULE | Freq: Every day | ORAL | Status: DC

## 2021-09-08 MED ORDER — GUAIFENESIN ER 600 MG PO TB12
600.0000 mg | ORAL_TABLET | Freq: Two times a day (BID) | ORAL | Status: DC
  Administered 2021-09-08 – 2021-09-19 (×20): 600 mg via ORAL
  Filled 2021-09-08 (×22): qty 1

## 2021-09-08 MED ORDER — ONDANSETRON HCL 4 MG/2ML IJ SOLN: 4.0000 mg | Freq: Four times a day (QID) | INTRAMUSCULAR | Status: DC | PRN

## 2021-09-08 MED ORDER — METHOCARBAMOL 500 MG PO TABS
1000.0000 mg | ORAL_TABLET | Freq: Three times a day (TID) | ORAL | Status: DC
  Administered 2021-09-08 – 2021-09-19 (×32): 1000 mg via ORAL
  Filled 2021-09-08 (×32): qty 2

## 2021-09-08 MED ORDER — LIDOCAINE 5 % EX PTCH
1.0000 | MEDICATED_PATCH | CUTANEOUS | Status: DC
  Administered 2021-09-09 – 2021-09-19 (×9): 1 via TRANSDERMAL
  Filled 2021-09-08 (×11): qty 1

## 2021-09-08 MED ORDER — OXYCODONE HCL 5 MG PO TABS
5.0000 mg | ORAL_TABLET | ORAL | Status: DC | PRN
  Administered 2021-09-09: 5 mg via ORAL
  Administered 2021-09-10 – 2021-09-12 (×4): 10 mg via ORAL
  Filled 2021-09-08: qty 1
  Filled 2021-09-08: qty 2
  Filled 2021-09-08: qty 1
  Filled 2021-09-08 (×3): qty 2

## 2021-09-08 MED ORDER — ENOXAPARIN SODIUM 40 MG/0.4ML IJ SOSY: 40.0000 mg | PREFILLED_SYRINGE | Freq: Two times a day (BID) | INTRAMUSCULAR | Status: DC

## 2021-09-08 MED ORDER — POLYETHYLENE GLYCOL 3350 17 G PO PACK
17.0000 g | PACK | Freq: Two times a day (BID) | ORAL | Status: DC
  Administered 2021-09-08 – 2021-09-17 (×16): 17 g via ORAL
  Filled 2021-09-08 (×21): qty 1

## 2021-09-08 MED ORDER — BISACODYL 10 MG RE SUPP: 10.0000 mg | Freq: Every day | RECTAL | Status: DC | PRN

## 2021-09-08 MED ORDER — SODIUM CHLORIDE 1 G PO TABS
1.0000 g | ORAL_TABLET | Freq: Three times a day (TID) | ORAL | Status: DC
  Administered 2021-09-08 – 2021-09-12 (×11): 1 g via ORAL
  Filled 2021-09-08 (×11): qty 1

## 2021-09-08 MED ORDER — POLYETHYLENE GLYCOL 3350 17 G PO PACK: 17.0000 g | PACK | Freq: Every day | ORAL | Status: DC | PRN

## 2021-09-08 MED ORDER — TAMSULOSIN HCL 0.4 MG PO CAPS
0.4000 mg | ORAL_CAPSULE | Freq: Every day | ORAL | Status: DC
  Administered 2021-09-09 – 2021-09-19 (×11): 0.4 mg via ORAL
  Filled 2021-09-08 (×11): qty 1

## 2021-09-08 MED ORDER — CALCIUM CARBONATE ANTACID 500 MG PO CHEW
1.0000 | CHEWABLE_TABLET | Freq: Three times a day (TID) | ORAL | Status: DC
  Administered 2021-09-08 – 2021-09-19 (×32): 200 mg via ORAL
  Filled 2021-09-08 (×32): qty 1

## 2021-09-08 MED ORDER — ONDANSETRON 4 MG PO TBDP: 4.0000 mg | ORAL_TABLET | Freq: Four times a day (QID) | ORAL | Status: DC | PRN
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
## 2021-09-08 NOTE — Plan of Care (Signed)

## 2021-09-08 NOTE — Progress Notes (Signed)
Inpatient Rehabilitation Admission Medication Review by a Pharmacist  A complete drug regimen review was completed for this patient to identify any potential clinically significant medication issues.  High Risk Drug Classes Is patient taking? Indication by Medication  Antipsychotic No   Anticoagulant Yes Lovenox-VTE ppx  Antibiotic Yes, as an intravenous medication Cefepime-HCAP  Opioid Yes Oxycodone prn pain  Antiplatelet No   Hypoglycemics/insulin No   Vasoactive Medication Yes Flomax-urinary retention  Chemotherapy No   Other Yes Gabapentin, Robaxin, Lidocaine patch -pain, NaCl tabs for hyponatremia, Docusate-stool softener, Tums     Type of Medication Issue Identified Description of Issue Recommendation(s)  Drug Interaction(s) (clinically significant)     Duplicate Therapy     Allergy     No Medication Administration End Date     Incorrect Dose     Additional Drug Therapy Needed     Significant med changes from prior encounter (inform family/care partners about these prior to discharge).    Other       Clinically significant medication issues were identified that warrant physician communication and completion of prescribed/recommended actions by midnight of the next day:  No   Time spent performing this drug regimen review (minutes):  20   Thank you for allowing [place] to participate in this patients care. Signe Colt, PharmD 09/08/2021 4:48 PM  **Pharmacist phone directory can be found on [email or web address] listed under [facility]**
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
## 2021-09-09 ENCOUNTER — Encounter (HOSPITAL_COMMUNITY): Payer: No Typology Code available for payment source

## 2021-09-09 ENCOUNTER — Inpatient Hospital Stay (HOSPITAL_COMMUNITY): Payer: No Typology Code available for payment source

## 2021-09-09 DIAGNOSIS — M7989 Other specified soft tissue disorders: Secondary | ICD-10-CM

## 2021-09-09 DIAGNOSIS — E871 Hypo-osmolality and hyponatremia: Secondary | ICD-10-CM

## 2021-09-09 DIAGNOSIS — D72829 Elevated white blood cell count, unspecified: Secondary | ICD-10-CM

## 2021-09-09 DIAGNOSIS — K5900 Constipation, unspecified: Secondary | ICD-10-CM

## 2021-09-09 MED ORDER — MELATONIN 3 MG PO TABS
3.0000 mg | ORAL_TABLET | Freq: Every day | ORAL | Status: DC
Start: 2021-09-09 — End: 2021-09-19
  Administered 2021-09-09 – 2021-09-18 (×11): 3 mg via ORAL
  Filled 2021-09-09 (×11): qty 1

## 2021-09-09 NOTE — Discharge Instructions (Signed)
Inpatient Rehab Discharge Instructions  [facility] Discharge date and time: No discharge date for patient encounter.   Activities/Precautions/ Functional Status: Activity: Weightbearing as tolerated with LSO back brace as directed Diet: Regular Wound Care: Routine skin checks Functional status:  ___ No restrictions     ___ Walk up steps independently ___ 24/7 supervision/assistance   ___ Walk up steps with assistance ___ Intermittent supervision/assistance  ___ Bathe/dress independently ___ Walk with walker     __x_ Bathe/dress with assistance ___ Walk Independently    ___ Shower independently ___ Walk with assistance    ___ Shower with assistance ___ No alcohol     ___ Return to work/school ________  Special Instructions:  No driving smoking or alcohol  My questions have been answered and I understand these instructions. I will adhere to these goals and the provided educational materials after my discharge from the hospital.  Patient/Caregiver Signature _______________________________ Date __________  Clinician Signature _______________________________________ Date __________  Please bring this form and your medication list with you to all your follow-up doctor's appointments.
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
## 2021-09-09 NOTE — Progress Notes (Signed)
Bilateral lower extremity venous duplex has been completed. Preliminary results can be found in CV Proc through chart review.   09/09/21 11:10 AM Olen Cordial RVT
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
## 2021-09-09 NOTE — Plan of Care (Signed)
Problem: RH Balance Goal: LTG: Patient will maintain dynamic sitting balance (OT) Description: LTG:  Patient will maintain dynamic sitting balance with assistance during activities of daily living (OT) Flowsheets (Taken 09/09/2021 1208) LTG: Pt will maintain dynamic sitting balance during ADLs with: Independent Goal: LTG Patient will maintain dynamic standing with ADLs (OT) Description: LTG:  Patient will maintain dynamic standing balance with assist during activities of daily living (OT)  Flowsheets (Taken 09/09/2021 1208) LTG: Pt will maintain dynamic standing balance during ADLs with: Supervision/Verbal cueing   Problem: Sit to Stand Goal: LTG:  Patient will perform sit to stand in prep for activites of daily living with assistance level (OT) Description: LTG:  Patient will perform sit to stand in prep for activites of daily living with assistance level (OT) Flowsheets (Taken 09/09/2021 1208) LTG: PT will perform sit to stand in prep for activites of daily living with assistance level: Supervision/Verbal cueing   Problem: RH Grooming Goal: LTG Patient will perform grooming w/assist,cues/equip (OT) Description: LTG: Patient will perform grooming with assist, with/without cues using equipment (OT) Flowsheets (Taken 09/09/2021 1208) LTG: Pt will perform grooming with assistance level of: Supervision/Verbal cueing   Problem: RH Bathing Goal: LTG Patient will bathe all body parts with assist levels (OT) Description: LTG: Patient will bathe all body parts with assist levels (OT) Flowsheets (Taken 09/09/2021 1208) LTG: Pt will perform bathing with assistance level/cueing: Supervision/Verbal cueing LTG: Position pt will perform bathing: Shower   Problem: RH Dressing Goal: LTG Patient will perform upper body dressing (OT) Description: LTG Patient will perform upper body dressing with assist, with/without cues (OT). Flowsheets (Taken 09/09/2021 1208) LTG: Pt will perform upper body dressing  with assistance level of: Supervision/Verbal cueing Goal: LTG Patient will perform lower body dressing w/assist (OT) Description: LTG: Patient will perform lower body dressing with assist, with/without cues in positioning using equipment (OT) Flowsheets (Taken 09/09/2021 1208) LTG: Pt will perform lower body dressing with assistance level of: Supervision/Verbal cueing   Problem: RH Toileting Goal: LTG Patient will perform toileting task (3/3 steps) with assistance level (OT) Description: LTG: Patient will perform toileting task (3/3 steps) with assistance level (OT)  Flowsheets (Taken 09/09/2021 1208) LTG: Pt will perform toileting task (3/3 steps) with assistance level: Supervision/Verbal cueing   Problem: RH Functional Use of Upper Extremity Goal: LTG Patient will use RT/LT upper extremity as a (OT) Description: LTG: Patient will use right/left upper extremity as a stabilizer/gross assist/diminished/nondominant/dominant level with assist, with/without cues during functional activity (OT) Flowsheets (Taken 09/09/2021 1208) LTG: Use of upper extremity in functional activities: RUE as gross assist level LTG: Pt will use upper extremity in functional activity with assistance level of: Independent   Problem: RH Simple Meal Prep Goal: LTG Patient will perform simple meal prep w/assist (OT) Description: LTG: Patient will perform simple meal prep with assistance, with/without cues (OT). Flowsheets (Taken 09/09/2021 1208) LTG: Pt will perform simple meal prep with assistance level of: Supervision/Verbal cueing LTG: Pt will perform simple meal prep w/level of: Ambulate without device   Problem: RH Light Housekeeping Goal: LTG Patient will perform light housekeeping w/assist (OT) Description: LTG: Patient will perform light housekeeping with assistance, with/without cues (OT). Flowsheets (Taken 09/09/2021 1208) LTG: Pt will perform light housekeeping with assistance level of: Supervision/Verbal  cueing LTG: Pt will perform light housekeeping w/level of: Ambulate without device   Problem: RH Toilet Transfers Goal: LTG Patient will perform toilet transfers w/assist (OT) Description: LTG: Patient will perform toilet transfers with assist, with/without cues using equipment (  OT) Flowsheets (Taken 09/09/2021 1208) LTG: Pt will perform toilet transfers with assistance level of: Independent with assistive device   Problem: RH Tub/Shower Transfers Goal: LTG Patient will perform tub/shower transfers w/assist (OT) Description: LTG: Patient will perform tub/shower transfers with assist, with/without cues using equipment (OT) Flowsheets (Taken 09/09/2021 1208) LTG: Pt will perform tub/shower stall transfers with assistance level of: Supervision/Verbal cueing LTG: Pt will perform tub/shower transfers from: Tub/shower combination   Problem: RH Memory Goal: LTG Patient will demonstrate ability for day to day recall/carry over during activities of daily living with assistance level (OT) Description: LTG:  Patient will demonstrate ability for day to day recall/carry over during activities of daily living with assistance level (OT). Flowsheets (Taken 09/09/2021 1208) LTG:  Patient will demonstrate ability for day to day recall/carry over during activities of daily living with assistance level (OT): Independent

## 2021-09-09 NOTE — Plan of Care (Signed)
  Problem: RH Balance Goal: LTG Patient will maintain dynamic standing balance (PT) Description: LTG:  Patient will maintain dynamic standing balance with assistance during mobility activities (PT) Flowsheets (Taken 09/09/2021 1235) LTG: Pt will maintain dynamic standing balance during mobility activities with:: Independent with assistive device    Problem: Sit to Stand Goal: LTG:  Patient will perform sit to stand with assistance level (PT) Description: LTG:  Patient will perform sit to stand with assistance level (PT) Flowsheets (Taken 09/09/2021 1235) LTG: PT will perform sit to stand in preparation for functional mobility with assistance level: Independent with assistive device   Problem: RH Bed Mobility Goal: LTG Patient will perform bed mobility with assist (PT) Description: LTG: Patient will perform bed mobility with assistance, with/without cues (PT). Flowsheets (Taken 09/09/2021 1235) LTG: Pt will perform bed mobility with assistance level of: Independent with assistive device    Problem: RH Bed to Chair Transfers Goal: LTG Patient will perform bed/chair transfers w/assist (PT) Description: LTG: Patient will perform bed to chair transfers with assistance (PT). Flowsheets (Taken 09/09/2021 1235) LTG: Pt will perform Bed to Chair Transfers with assistance level: Independent with assistive device    Problem: RH Car Transfers Goal: LTG Patient will perform car transfers with assist (PT) Description: LTG: Patient will perform car transfers with assistance (PT). Flowsheets (Taken 09/09/2021 1235) LTG: Pt will perform car transfers with assist:: Independent with assistive device    Problem: RH Ambulation Goal: LTG Patient will ambulate in controlled environment (PT) Description: LTG: Patient will ambulate in a controlled environment, # of feet with assistance (PT). Flowsheets (Taken 09/09/2021 1235) LTG: Pt will ambulate in controlled environ  assist needed:: Independent with assistive  device LTG: Ambulation distance in controlled environment: 150 ft with LRAD Goal: LTG Patient will ambulate in home environment (PT) Description: LTG: Patient will ambulate in home environment, # of feet with assistance (PT). Flowsheets (Taken 09/09/2021 1235) LTG: Pt will ambulate in home environ  assist needed:: Independent with assistive device LTG: Ambulation distance in home environment: 75 ft with LRAD

## 2021-09-10 LAB — CBC WITH DIFFERENTIAL/PLATELET
Abs Immature Granulocytes: 0.07 10*3/uL (ref 0.00–0.07)
Basophils Absolute: 0.1 10*3/uL (ref 0.0–0.1)
Basophils Relative: 1 %
Eosinophils Absolute: 0.4 10*3/uL (ref 0.0–0.5)
Eosinophils Relative: 4 %
HCT: 29.3 % — ABNORMAL LOW (ref 39.0–52.0)
Hemoglobin: 9.8 g/dL — ABNORMAL LOW (ref 13.0–17.0)
Immature Granulocytes: 1 %
Lymphocytes Relative: 32 %
Lymphs Abs: 2.9 10*3/uL (ref 0.7–4.0)
MCH: 28.6 pg (ref 26.0–34.0)
MCHC: 33.4 g/dL (ref 30.0–36.0)
MCV: 85.4 fL (ref 80.0–100.0)
Monocytes Absolute: 1 10*3/uL (ref 0.1–1.0)
Monocytes Relative: 11 %
Neutro Abs: 4.8 10*3/uL (ref 1.7–7.7)
Neutrophils Relative %: 51 %
Platelets: 330 10*3/uL (ref 150–400)
RBC: 3.43 MIL/uL — ABNORMAL LOW (ref 4.22–5.81)
RDW: 15.7 % — ABNORMAL HIGH (ref 11.5–15.5)
WBC: 9.2 10*3/uL (ref 4.0–10.5)
nRBC: 0 % (ref 0.0–0.2)

## 2021-09-10 LAB — COMPREHENSIVE METABOLIC PANEL
ALT: 104 U/L — ABNORMAL HIGH (ref 0–44)
AST: 74 U/L — ABNORMAL HIGH (ref 15–41)
Albumin: 2 g/dL — ABNORMAL LOW (ref 3.5–5.0)
Alkaline Phosphatase: 81 U/L (ref 38–126)
Anion gap: 5 (ref 5–15)
BUN: 26 mg/dL — ABNORMAL HIGH (ref 8–23)
CO2: 25 mmol/L (ref 22–32)
Calcium: 8.3 mg/dL — ABNORMAL LOW (ref 8.9–10.3)
Chloride: 113 mmol/L — ABNORMAL HIGH (ref 98–111)
Creatinine, Ser: 1.23 mg/dL (ref 0.61–1.24)
GFR, Estimated: >60 mL/min (ref >60)
Glucose, Bld: 106 mg/dL — ABNORMAL HIGH (ref 70–99)
Potassium: 3.6 mmol/L (ref 3.5–5.1)
Sodium: 143 mmol/L (ref 135–145)
Total Bilirubin: 1.1 mg/dL (ref 0.3–1.2)
Total Protein: 7 g/dL (ref 6.5–8.1)

## 2021-09-10 MED ORDER — GABAPENTIN 300 MG PO CAPS
600.0000 mg | ORAL_CAPSULE | Freq: Four times a day (QID) | ORAL | Status: DC
  Administered 2021-09-10 – 2021-09-19 (×36): 600 mg via ORAL
  Filled 2021-09-10 (×36): qty 2

## 2021-09-10 MED ORDER — GABAPENTIN 100 MG PO CAPS
200.0000 mg | ORAL_CAPSULE | Freq: Once | ORAL | Status: AC
Start: 2021-09-10 — End: 2021-09-10
  Administered 2021-09-10: 200 mg via ORAL
  Filled 2021-09-10: qty 2

## 2021-09-10 NOTE — Plan of Care (Signed)
  Problem: RH Memory Goal: LTG Patient will demonstrate ability for day to day (SLP) Description: LTG:   Patient will demonstrate ability for day to day recall/carryover during cognitive/linguistic activities with assist  (SLP) Flowsheets (Taken 09/10/2021 1436) LTG: Patient will demonstrate ability for day to day recall: Daily complex information LTG: Patient will demonstrate ability for day to day recall/carryover during cognitive/linguistic activities with assist (SLP): Modified Independent

## 2021-09-10 NOTE — Progress Notes (Signed)
Inpatient Rehabilitation  Patient information reviewed and entered into eRehab system by Laveta Gilkey M. Kolbie Lepkowski, M.A., CCC/SLP, PPS Coordinator.  Information including medical coding, functional ability and quality indicators will be reviewed and updated through discharge.    
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
## 2021-09-10 NOTE — Progress Notes (Signed)
Inpatient Rehabilitation Center Individual Statement of Services  Patient Name:  Justin Bauer  Date:  09/10/2021  Welcome to the Inpatient Rehabilitation Center.  Our goal is to provide you with an individualized program based on your diagnosis and situation, designed to meet your specific needs.  With this comprehensive rehabilitation program, you will be expected to participate in at least 3 hours of rehabilitation therapies Monday-Friday, with modified therapy programming on the weekends.  Your rehabilitation program will include the following services:  Physical Therapy (PT), Occupational Therapy (OT), 24 hour per day rehabilitation nursing, Neuropsychology, Care Coordinator, Rehabilitation Medicine, Nutrition Services, and Pharmacy Services  Weekly team conferences will be held on Tuesday to discuss your progress.  Your Inpatient Rehabilitation Care Coordinator will talk with you frequently to get your input and to update you on team discussions.  Team conferences with you and your family in attendance may also be held.  Expected length of stay: 10-12 days  Overall anticipated outcome: supervision-mod/I level  Depending on your progress and recovery, your program may change. Your Inpatient Rehabilitation Care Coordinator will coordinate services and will keep you informed of any changes. Your Inpatient Rehabilitation Care Coordinator's name and contact numbers are listed  below.  The following services may also be recommended but are not provided by the Inpatient Rehabilitation Center:  Driving Evaluations Home Health Rehabiltiation Services Outpatient Rehabilitation Services Vocational Rehabilitation   Arrangements will be made to provide these services after discharge if needed.  Arrangements include referral to agencies that provide these services.  Your insurance has been verified to be:  [employer] Your primary doctor is:  [place] in [place]  Pertinent information will be shared with your  doctor and your insurance company.  Inpatient Rehabilitation Care Coordinator:  Dossie Der, [place] [phone] or Luna [place]  Information discussed with and copy given to patient by: Lucy Chris, 09/10/2021, 10:32 AM
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
## 2021-09-10 NOTE — Progress Notes (Signed)
Physical Therapy Session Note  Patient Details  Name: Justin Bauer MRN: [id number] Date of Birth: 04/28/1960  Today's Date: 09/10/2021 PT Individual Time: 0800-0900 PT Individual Time Calculation (min): 60 min   Short Term Goals: Week 1:  PT Short Term Goal 1 (Week 1): =LTG due to ELOS  Skilled Therapeutic Interventions/Progress Updates:    pt received in bed and agreeable to therapy. Pt reports R shoulder pain, RN present for meds pass at start of session to provide pain medication. Bed mobility with supervision, donned TLSO in sitting with assist.  Pt ambulated to/from room with no AD and CGA, noted increased upper trunk, cervical flexion, R>L. Improved with cueing but inconsistent.   Pt was directed in berg balance scale as documented below and educated on fall risk. Patient demonstrates increased fall risk as noted by score of   37/56 on Berg Balance Scale.  (<36= high risk for falls, close to 100%; 37-45 significant >80%; 46-51 moderate >50%; 52-55 lower >25%)   Pt then performed Step taps 3 x 20 with CGA for improved balance and coordination. Pt self selects quick pace. No LOB noted.  Side steps 3 x 2 laps of 18 ft with CGA and cues for neutral hip rotation and foot clearance. Pt demoes fatigue and required seated rest breaks between activity.   Pt returned to room and to bed, doffed brace with assist. Sit>supine with supervision. Pt remained in bed and was left with all needs in reach and alarm active.   Therapy Documentation Precautions:  Precautions Precautions: Back, Fall, Shoulder Type of Shoulder Precautions: sling for comfort, WBAT Shoulder Interventions: Shoulder sling/immobilizer, For comfort Precaution Booklet Issued: No Precaution Comments: reviewed back precautions, pt able to recall BLT Required Braces or Orthoses: Spinal Brace Cervical Brace:  (soft collar d/c per chart) Spinal Brace: Thoracolumbosacral orthotic, Applied in sitting position (to wear TLSO whenever  OOB, donned EOB) Restrictions Weight Bearing Restrictions: Yes RUE Weight Bearing: Weight bearing as tolerated Other Position/Activity Restrictions: sling for comfort General:     Balance: Balance Balance Assessed: Yes Standardized Balance Assessment Standardized Balance Assessment: Berg Balance Test Berg Balance Test Sit to Stand: Able to stand  independently using hands Standing Unsupported: Able to stand safely 2 minutes Sitting with Back Unsupported but Feet Supported on Floor or Stool: Able to sit safely and securely 2 minutes Stand to Sit: Uses backs of legs against chair to control descent Transfers: Able to transfer safely, definite need of hands Standing Unsupported with Eyes Closed: Able to stand 10 seconds with supervision Standing Ubsupported with Feet Together: Able to place feet together independently and stand for 1 minute with supervision From Standing, Reach Forward with Outstretched Arm: Can reach forward >5 cm safely (2") From Standing Position, Pick up Object from Floor: Able to pick up shoe, needs supervision From Standing Position, Turn to Look Behind Over each Shoulder: Turn sideways only but maintains balance (limited by TLSO) Turn 360 Degrees: Able to turn 360 degrees safely one side only in 4 seconds or less Standing Unsupported, Alternately Place Feet on Step/Stool: Able to stand independently and complete 8 steps >20 seconds Standing Unsupported, One Foot in Front: Loses balance while stepping or standing Standing on One Leg: Able to lift leg independently and hold equal to or more than 3 seconds Total Score: 37     Therapy/Group: Individual Therapy  Juluis Rainier 09/10/2021, 8:36 AM
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
## 2021-09-11 ENCOUNTER — Encounter (HOSPITAL_COMMUNITY): Payer: Self-pay | Admitting: Physical Medicine and Rehabilitation

## 2021-09-11 MED ORDER — ENSURE ENLIVE PO LIQD
237.0000 mL | Freq: Three times a day (TID) | ORAL | Status: DC
  Administered 2021-09-11 – 2021-09-19 (×24): 237 mL via ORAL

## 2021-09-11 NOTE — Progress Notes (Signed)
Physical Therapy Session Note  Patient Details  Name: Justin Bauer MRN: [id number] Date of Birth: 1961/01/10  Today's Date: 09/11/2021 PT Individual Time: 0800-0918 PT Individual Time Calculation (min): 78 min   Short Term Goals: Week 1:  PT Short Term Goal 1 (Week 1): =LTG due to ELOS  Skilled Therapeutic Interventions/Progress Updates:    Pt seated in w/c on arrival and agreeable to therapy. Pt reports 5/10 pain in the R shoulder, premedicated. Rest and positioning provided as needed. Adjusted TLSO in standing. Pt ambulated to/from therapy gym with CGA and AD for endurance and functional mobility cued for upright posture and wider BOS. Pt then navigated 6" stairs 4 x 12 with seated rest break, 1 hand rail (pt using L hand throughout), min A for knee block fading to CGA. Pt then ambulated >500 ft to NT, continued cueing for upright posture, wide BOS, increased R arm swing. After seated rest break, pt ambulated back to day room with one seated rest break, >600 ft. Discussed energy conservation and resting when needed for safety. Pt then performed box squats to mat table with supervision, 5 x 10 for endurance and LE strength. Pt then utilized kinetron 3 x 1 min in standing at 60 cm/sec for strength, endurance, and balance. Pt ambulated back to room in the same manner and was left with all needs in reach and alarm active.     Therapy Documentation Precautions:  Precautions Precautions: Back, Fall, Shoulder Type of Shoulder Precautions: sling for comfort, WBAT Shoulder Interventions: Shoulder sling/immobilizer, For comfort Precaution Booklet Issued: No Precaution Comments: reviewed back precautions, pt able to recall BLT Required Braces or Orthoses: Spinal Brace Cervical Brace:  (soft collar d/c per chart) Spinal Brace: Thoracolumbosacral orthotic, Applied in sitting position (to wear TLSO whenever OOB, donned EOB) Restrictions Weight Bearing Restrictions: Yes RUE Weight Bearing: Weight  bearing as tolerated Other Position/Activity Restrictions: sling for comfort General:       Therapy/Group: Individual Therapy  Juluis Rainier 09/11/2021, 8:47 AM

## 2021-09-11 NOTE — Progress Notes (Signed)
Physical Therapy Session Note  Patient Details  Name: Justin Bauer MRN: [id number] Date of Birth: 1960/08/14  Today's Date: 09/11/2021 PT Individual Time: 1002-1045 PT Individual Time Calculation (min): 43 min   Short Term Goals: Week 1:  PT Short Term Goal 1 (Week 1): =LTG due to ELOS  Skilled Therapeutic Interventions/Progress Updates:   Received pt semi-reclined in bed on phone, pt agreeable to PT treatment, and denied any pain during session, just "stiffness". Session with emphasis on functional mobility/transfers, generalized strengthening and endurance, dynamic standing balance/coordination, NMR, and gait training. Pt transferred semi-reclined<>sitting EOB with HOB elevated and supervision and donned TLSO with max A. Pt performed all transfers without AD and CGA throughout session. Pt ambulated 11ft without AD and CGA to therapy gym. Pt performed the following activities on [employer] with emphasis on dynamic standing balance/coordination: -weight shift training on level static with BUE support fading to 1UE support then no UE support for 1.5 minutes with supervision -weight shift training on level 5 with BUE support fading to 1 support for 1.5 minutes. Attempted without UE support but pt demonstrated LOB requiring mod A to correct.  -limits of stability training on level static with 1 UE support for 1 minute and 13 seconds and close supervision  -limits of stability training on level 5 with 1 UE support for 34 seconds x 1 and 39 seconds x 1 with CGA/close supervision Ambulated to // bars and performed forward/backward tandem walking x 6 laps without UE support and CGA fading to min A for balance. Transitioned to lateral monster squats with red TB x 6 laps without UE support and close supervision for balance. Finished with x20 squats without UE support and close supervision. Pt ambulated >121ft without AD and CGA back to room and requested to sit in WC. Concluded session with pt sitting in WC,  needs within reach, and seatbelt alarm on. Provided pt with fresh drink and RN present at bedside attending to care.    Therapy Documentation Precautions:  Precautions Precautions: Back, Fall, Shoulder Type of Shoulder Precautions: sling for comfort, WBAT Shoulder Interventions: Shoulder sling/immobilizer, For comfort Precaution Booklet Issued: No Precaution Comments: reviewed back precautions, pt able to recall BLT Required Braces or Orthoses: Spinal Brace Cervical Brace:  (soft collar d/c per chart) Spinal Brace: Thoracolumbosacral orthotic, Applied in sitting position (to wear TLSO whenever OOB, donned EOB) Restrictions Weight Bearing Restrictions: Yes RUE Weight Bearing: Weight bearing as tolerated Other Position/Activity Restrictions: sling for comfort  Therapy/Group: Individual Therapy Martin Majestic PT, DPT  09/11/2021, 7:01 AM
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
## 2021-09-11 NOTE — Plan of Care (Signed)
  Problem: RH Problem Solving Goal: LTG Patient will demonstrate problem solving for (SLP) Description: LTG:  Patient will demonstrate problem solving for basic/complex daily situations with cues  (SLP) Flowsheets (Taken 09/11/2021 1552) LTG: Patient will demonstrate problem solving for (SLP): Complex daily situations LTG Patient will demonstrate problem solving for: Modified Independent   Problem: RH Awareness Goal: LTG: Patient will demonstrate awareness during functional activites type of (SLP) Description: LTG: Patient will demonstrate awareness during functional activites type of (SLP) Flowsheets (Taken 09/11/2021 1552) Patient will demonstrate during cognitive/linguistic activities awareness type of: Anticipatory LTG: Patient will demonstrate awareness during cognitive/linguistic activities with assistance of (SLP): Modified Independent

## 2021-09-11 NOTE — Progress Notes (Signed)
Speech Language Pathology Daily Session Note  Patient Details  Name: Justin Bauer MRN: [id number] Date of Birth: 1960/06/28  Today's Date: 09/11/2021 SLP Individual Time: 1445-1530 SLP Individual Time Calculation (min): 45 min  Short Term Goals: Week 1: SLP Short Term Goal 1 (Week 1): Pt will demonstrate recall of daily, novel information with mod I use of internal strategies. SLP Short Term Goal 2 (Week 1): Pt will participate in further cognitive assessment. SLP Short Term Goal 2 - Progress (Week 1): Met SLP Short Term Goal 3 (Week 1): Patient will complete complex problem solving tasks with sup A SLP Short Term Goal 4 (Week 1): Patient will ID and self correct errors during functional cognitive tasks with sup A  Skilled Therapeutic Interventions: Skilled ST treatment focused on cognitive goals. Pt agreeable to further cognitive-linguistic evaluation via Cognitive Linguistic Quick Test (CLQT), as initiated previous session. Pt scored WNL with language, and mild impairment in regards to the following cognitive domains: attention, memory, executive functions, language, and visuospatial skills. Pt reports feeling near cognitive baseline but noted "that really made me think" following assessment. ST/LTGs updated to add complex problem solving and awareness goals. Pt educated of results and verbalized understanding and agreement with updated SLP POC. Patient was left in bed with alarm activated and immediate needs within reach at end of session. Continue per current plan of care.      Pain Pain Assessment Pain Scale: 0-10 Pain Score: 0-No pain  Therapy/Group: Individual Therapy  Patty Sermons 09/11/2021, 3:48 PM

## 2021-09-11 NOTE — Progress Notes (Signed)
Occupational Therapy Session Note  Patient Details  Name: Justin Bauer MRN: [id number] Date of Birth: 07/26/1960  Today's Date: 09/11/2021 OT Individual Time: 1300-1415 OT Individual Time Calculation (min): 75 min    Short Term Goals: Week 1:  OT Short Term Goal 1 (Week 1): Pt to be Min A for LB ADLs with appropriate AE OT Short Term Goal 2 (Week 1): Pt to be SBA for toileting tasks OT Short Term Goal 3 (Week 1): Pt will demonstrate improved use of the R UE during funcitonal tasks OT Short Term Goal 4 (Week 1): Pt to be Supervision for functional transfers  Skilled Therapeutic Interventions/Progress Updates:   Pt in bed resting upon OT arrival and agreeable to all activity. Focus on session on R UE ROM, gentle strength training, R NMRE, HEP training and pain management. Pt reported 6/10 pain in R UE and scapula at start of session with Lidoderm in place on R clavicle region. Pt tolerated 10 minutes of moist heat to R sh and scapula. Pt reported 0/10 pain following heat and OT applied Urias Air splint and was able to perform gentle shoulder ROM and scapular gliding with no report of pain. Pt then able to perform a series of 10 reps of each mvmt of slow scap protraction/retraction, sh flex/ex, sh horizontal abd/add and simple place and hold AAROM with mod facilitation and joint support and still no pain reported. Pt then sat to don TLSO with mod A. Amb with gait belt and no AD ~ 100 ft x 2 to therapy day room/gym space and sat for towel slides 10 reps for sh flexion/ex, clasped hand to nose, sh shrugs and blade squeezes all 10 reps each. OT applied ktape to R deltoid and R supra and sub scap mm with slight sh elevation and scap retraction. Pt stood for pendulums with R UE with L UE support for 10 reps in each circular direction. Once back in room, pt returned to bed, OT assisted with TLSO doffing and reminded to support R UE in bed with towl roll. Pt reported 0/10 pain. OT completed the session  with bed exit alarm set, call button for nursing and needs within reach.   Therapy Documentation Precautions:  Precautions Precautions: Back, Fall, Shoulder Type of Shoulder Precautions: sling for comfort, WBAT Shoulder Interventions: Shoulder sling/immobilizer, For comfort Precaution Booklet Issued: No Precaution Comments: reviewed back precautions, pt able to recall BLT Required Braces or Orthoses: Spinal Brace Cervical Brace:  (soft collar d/c per chart) Spinal Brace: Thoracolumbosacral orthotic, Applied in sitting position (to wear TLSO whenever OOB, donned EOB) Restrictions Weight Bearing Restrictions: Yes RUE Weight Bearing: Weight bearing as tolerated Other Position/Activity Restrictions: sling for comfort    Therapy/Group: Individual Therapy  Vicenta Dunning 09/11/2021, 12:51 PM

## 2021-09-12 LAB — URIC ACID: Uric Acid, Serum: 3.8 mg/dL (ref 3.7–8.6)

## 2021-09-12 LAB — BASIC METABOLIC PANEL
Anion gap: 6 (ref 5–15)
BUN: 14 mg/dL (ref 8–23)
CO2: 25 mmol/L (ref 22–32)
Calcium: 8.4 mg/dL — ABNORMAL LOW (ref 8.9–10.3)
Chloride: 105 mmol/L (ref 98–111)
Creatinine, Ser: 0.68 mg/dL (ref 0.61–1.24)
GFR, Estimated: >60 mL/min (ref >60)
Glucose, Bld: 94 mg/dL (ref 70–99)
Potassium: 4.1 mmol/L (ref 3.5–5.1)
Sodium: 136 mmol/L (ref 135–145)

## 2021-09-12 MED ORDER — PREDNISONE 5 MG PO TABS
30.0000 mg | ORAL_TABLET | Freq: Every day | ORAL | Status: AC
  Administered 2021-09-12 – 2021-09-15 (×4): 30 mg via ORAL
  Filled 2021-09-12 (×4): qty 2

## 2021-09-12 MED ORDER — SODIUM CHLORIDE 1 G PO TABS
1.0000 g | ORAL_TABLET | Freq: Two times a day (BID) | ORAL | Status: DC
  Administered 2021-09-12 – 2021-09-19 (×14): 1 g via ORAL
  Filled 2021-09-12 (×14): qty 1

## 2021-09-12 MED ORDER — OXYCODONE HCL 5 MG PO TABS
10.0000 mg | ORAL_TABLET | ORAL | Status: DC | PRN
  Administered 2021-09-12: 10 mg via ORAL
  Administered 2021-09-12 – 2021-09-14 (×2): 15 mg via ORAL
  Administered 2021-09-15 – 2021-09-16 (×3): 10 mg via ORAL
  Filled 2021-09-12: qty 3
  Filled 2021-09-12: qty 2
  Filled 2021-09-12: qty 3
  Filled 2021-09-12 (×3): qty 2

## 2021-09-12 NOTE — Progress Notes (Signed)
Physical Therapy Session Note  Patient Details  Name: Arcenio Mullaly MRN: [id number] Date of Birth: 1960/09/16  Today's Date: 09/12/2021 PT Individual Time: 9244-6286 PT Individual Time Calculation (min): 72 min   Short Term Goals: Week 1:  PT Short Term Goal 1 (Week 1): =LTG due to ELOS  Skilled Therapeutic Interventions/Progress Updates:    pt received in bed and agreeable to therapy. Pt reports 6/10 pain in R wrist, MD suspects gout. premedicated. Rest and positioning provided as needed. Pt reported no increase in pain with activity. Supine>sit with supervision and cueing. Donned/doffed TLSO with assist. Gait to day room with CGA and no AD, noted improvement in gait mechanics with more normalized stride from previous days. Pt participated in obstacle course for improved balance and endurance, including the following: Floor ladder-1 foot to each square 2 hurdles +2 airex Added after first 2 laps: Floor ladder in in out out, 2 steps forward 1 step back Side stepping with cone taps, 5 x 4 taps 2 laps each direction   Step ups on airex x 3 bouts to fatigue, CGA for improved balance and strength. Pt reports feeling good about his balance and is mostly worried about endurance. Transitioned to nustep for global LE strength and endurance. Pt performed 2 x 5 min at level 7, LE only, with cues for increased SPM >70 for increased cardiovascular work.  Pt ambulated back to room in the same manner as above and returned to bed with supervision and cues to maintain spinal precautions. Pt was left with all needs in reach and alarm active.    Therapy Documentation Precautions:  Precautions Precautions: Back, Fall, Shoulder Type of Shoulder Precautions: sling for comfort, WBAT Shoulder Interventions: Shoulder sling/immobilizer, For comfort Precaution Booklet Issued: No Precaution Comments: reviewed back precautions, pt able to recall BLT Required Braces or Orthoses: Spinal Brace Cervical Brace:   (soft collar d/c per chart) Spinal Brace: Thoracolumbosacral orthotic, Applied in sitting position (to wear TLSO whenever OOB, donned EOB) Restrictions Weight Bearing Restrictions: Yes RUE Weight Bearing: Weight bearing as tolerated Other Position/Activity Restrictions: sling for comfort General:       Therapy/Group: Individual Therapy  Juluis Rainier 09/12/2021, 1:18 PM
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
## 2021-09-12 NOTE — Progress Notes (Signed)
Reinforced education on the purpose and importance of Aspen Collar and Shoulder immobilizer. Contacted ortho for shoulder immobilizer, patient refused for shoulder immobilizer to be applied and refused to wear Aspen collar.

## 2021-09-12 NOTE — Progress Notes (Signed)
Occupational Therapy Session Note  Patient Details  Name: Justin Bauer MRN: [id number] Date of Birth: Apr 02, 1960  Today's Date: 09/12/2021 OT Individual Time: 0800-0900 OT Individual Time Calculation (min): 60 min    Short Term Goals: Week 1:  OT Short Term Goal 1 (Week 1): Pt to be Min A for LB ADLs with appropriate AE OT Short Term Goal 2 (Week 1): Pt to be SBA for toileting tasks OT Short Term Goal 3 (Week 1): Pt will demonstrate improved use of the R UE during funcitonal tasks OT Short Term Goal 4 (Week 1): Pt to be Supervision for functional transfers  Skilled Therapeutic Interventions/Progress Updates:  Pt in bed biting on a washcloth upon OT arrival and reports 10/10 pain R wrist. Pt has already seen MD and OT care coordination with nursing and PT re: new wrist pain findings. Appears to have heat and edema in joint and sensitive to any touch or movement. Nursing reported pain meds given. Pt asked for Ensure and apple juice as he has not been Justin to eat am meal due to pain. OT assisted with beverage intake and optimal positioning of R UE with elbow, forearm and wrist support and applied cool compresses throughout session. Pt reported "it is easing some" re: R wrist pain. Cloths hot when removed indicating pulling heat from joint. OT removed ktape applied yesterday from R scapula and shoulder to ensure no allergy possibility for R wrist new pain issues. Reapplied cool compresses after repositioning following po intake. Gentle retrograde massage tolerated to R fingers, thumb and forearm. Pt then reported 8/10 pain. OT wrapped wrist and hand briefly for support and gentle pressure during basic mobility and repositioning then removed to allow wrist support on towel roll and pillow prop. OT left pt at end of session with cool washcloths on R wrist area, R UE elevated on pillow and towel prop, bed exit alarm engaged and all needs in reach.   Therapy Documentation Precautions:   Precautions Precautions: Back, Fall, Shoulder Type of Shoulder Precautions: sling for comfort, WBAT Shoulder Interventions: Shoulder sling/immobilizer, For comfort Precaution Booklet Issued: No Precaution Comments: reviewed back precautions, pt Justin to recall BLT Required Braces or Orthoses: Spinal Brace Cervical Brace:  (soft collar d/c per chart) Spinal Brace: Thoracolumbosacral orthotic, Applied in sitting position (to wear TLSO whenever OOB, donned EOB) Restrictions Weight Bearing Restrictions: Yes RUE Weight Bearing: Weight bearing as tolerated Other Position/Activity Restrictions: sling for comfort    Therapy/Group: Individual Therapy  Vicenta Dunning 09/12/2021, 8:32 AM

## 2021-09-12 NOTE — Progress Notes (Signed)
Occupational Therapy Session Note  Patient Details  Name: Justin Bauer MRN: [id number] Date of Birth: 1960/10/17  Today's Date: 09/12/2021 OT Individual Time: 1500-1530 OT Individual Time Calculation (min): 30 min    Short Term Goals: Week 1:  OT Short Term Goal 1 (Week 1): Pt to be Min A for LB ADLs with appropriate AE OT Short Term Goal 2 (Week 1): Pt to be SBA for toileting tasks OT Short Term Goal 3 (Week 1): Pt will demonstrate improved use of the R UE during funcitonal tasks OT Short Term Goal 4 (Week 1): Pt to be Supervision for functional transfers  Skilled Therapeutic Interventions/Progress Updates:   Pt with significant improvement in pain and tolerance for activity vs this am and agreeable to light activity and was seen by PT prior to session with R wrist wrapped lightly with ACE for support. Autum RN present administering pain meds upon OT arrival. Ice bag and elevation has also been effective to reduce pain to 6/10 throughout day. OT applied SaeboStim One to R biceps for Estim as follows with no adverse skin reactions: 330 pulse width 35 Hz pulse rate On 8 sec/ off 8 sec Ramp up/ down 2 sec Symmetrical Biphasic wave form  Max intensity at 500 Ohm load  Pt was amazed at the ability he had following activation with R shoulder shrugs and general increased mm cocontraction in r proximal joint once stim removed. OT reinforced use of elevation, ice to R distal UE and joint protection for pain management. Completed session with pt resting comfortably in bed, pain 5/10 and bed exit alarm active with all needs and nurse call bell in place.   Therapy Documentation Precautions:  Precautions Precautions: Back, Fall, Shoulder Type of Shoulder Precautions: sling for comfort, WBAT Shoulder Interventions: Shoulder sling/immobilizer, For comfort Precaution Booklet Issued: No Precaution Comments: reviewed back precautions, pt able to recall BLT Required Braces or Orthoses:  Spinal Brace Cervical Brace:  (soft collar d/c per chart) Spinal Brace: Thoracolumbosacral orthotic, Applied in sitting position (to wear TLSO whenever OOB, donned EOB) Restrictions Weight Bearing Restrictions: Yes RUE Weight Bearing: Weight bearing as tolerated Other Position/Activity Restrictions: sling for comfort   Therapy/Group: Individual Therapy  Vicenta Dunning 09/12/2021, 3:26 PM

## 2021-09-12 NOTE — Progress Notes (Signed)
Speech Language Pathology Daily Session Note  Patient Details  Name: Justin Bauer MRN: [id number] Date of Birth: 1961/03/11  Today's Date: 09/12/2021 SLP Individual Time: 1002-1020 SLP Individual Time Calculation (min): 18 min  Short Term Goals: Week 1: SLP Short Term Goal 1 (Week 1): Pt will demonstrate recall of daily, novel information with mod I use of internal strategies. SLP Short Term Goal 2 (Week 1): Pt will participate in further cognitive assessment. SLP Short Term Goal 2 - Progress (Week 1): Met SLP Short Term Goal 3 (Week 1): Patient will complete complex problem solving tasks with sup A SLP Short Term Goal 4 (Week 1): Patient will ID and self correct errors during functional cognitive tasks with sup A  Skilled Therapeutic Interventions:Skilled ST services focused on cognitive skills. Pt expressed acute wrist pain and lack of sleep, requesting to end ST services to rest up for upcoming PT treatment. Pt was able to recall when pain medication was given and information from am OT session mod I. Pt directed care to increase comfort. Pt missed 25 minutes of treatment due to pain and fatigue. Pt was left in room with call bell within reach and bed alarm set. SLP recommends to continue skilled services.     Pain Pain Assessment Pain Scale: 0-10 Pain Score: 8  Pain Type: Acute pain Pain Location: Wrist Pain Orientation: Right Pain Intervention(s): Rest  Therapy/Group: Individual Therapy  Galya Dunnigan  [facility] 09/12/2021, 3:12 PM

## 2021-09-13 NOTE — Progress Notes (Signed)
Speech Language Pathology Daily Session Note  Patient Details  Name: Justin Bauer MRN: [id number] Date of Birth: [birth year]  Today's Date: 09/13/2021 SLP Individual Time: 1450-1536 SLP Individual Time Calculation (min): 46 min  Short Term Goals: Week 1: SLP Short Term Goal 1 (Week 1): Pt will demonstrate recall of daily, novel information with mod I use of internal strategies. SLP Short Term Goal 2 (Week 1): Pt will participate in further cognitive assessment. SLP Short Term Goal 2 - Progress (Week 1): Met SLP Short Term Goal 3 (Week 1): Patient will complete complex problem solving tasks with sup A SLP Short Term Goal 4 (Week 1): Patient will ID and self correct errors during functional cognitive tasks with sup A  Skilled Therapeutic Interventions: Skilled ST treatment focused on cognitive goals. SLP and pt completing medication management task to increase awareness of current medication regime. SLP reviewed current medications re: name of medication, dose, instructions, and purpose for taking. Pt recalled medication name/purpose following education with 90% accuracy. SLP facilitated medication management task targeting higher level problem solving and memory goals. Pt identified intentional medication organization errors in BID pillbox with 100% accuracy at mod I level. Pt also developed appropriate solutions and independently re-assessed box for accuracy during several occasions. Pt responded to hypothetical scenarios with appropriate reasoning and thoughtful responses. Pt in consideration of using a pillbox at discharge to keep medications organized. Recommended to have family double check pillbox for accuracy. Pt stated him and his brother would organize his father's pillbox and would also double each other for accuracy "just in case" and would be more than willing to ask sister to do the same for him. Patient was left in bed with alarm activated and immediate needs within reach at end of  session. Continue per current plan of care.      Pain Pain Assessment Pain Scale: 0-10 Pain Score: 0-No pain  Therapy/Group: Individual Therapy  Patty Sermons 09/13/2021, 3:05 PM

## 2021-09-13 NOTE — Progress Notes (Signed)
Physical Therapy Session Note  Patient Details  Name: Justin Bauer MRN: [id number] Date of Birth: [birth year]  Today's Date: 09/13/2021 PT Individual Time: 1015-1115 PT Individual Time Calculation (min): 60 min   Short Term Goals: Week 1:  PT Short Term Goal 1 (Week 1): =LTG due to ELOS  Skilled Therapeutic Interventions/Progress Updates:    Pt received seated in w/c in room handed off from OT session, agreeable to PT session. No complaints of pain. Per OT and per MD pt should be wearing hard collar, confirmed with MD that pt to wear hard collar when OOB and can don EOB. Sit to stand and transfers with no AD and CGA during session. Ambulation up to 200 ft with no AD and CGA during session, minor ataxia noted at times. Standing alt L/R 6" step-ups with L handrail and CGA for balance, x 10 reps B for strengthening. Standing squats 2 x 10 reps with no AD and CGA for balance, cues for correct body positioning during exercise performance. Sidesteps L/R and monster walk forwards/backwards in // bars with red theraband for LE strengthening. Standing alt L/R cone taps initially with LUE support progressing to no UE support and min A, progression from forward cone taps to diagonal cross-over cone taps. Agility ladder navigation with no AD and CGA to min A performing forward step-through, lateral step-to gait, and forward step-to with alt L/R cone taps. Pt requests to return to bed at end of session so he can remove braces. Assisted pt with doffing hard collar and TLSO while seated EOB. Sit to supine Supervision. Pt left supine in bed with needs in reach at end of session.  Therapy Documentation Precautions:  Precautions Precautions: Back, Fall, Shoulder Type of Shoulder Precautions: sling for comfort, WBAT Shoulder Interventions: Shoulder sling/immobilizer, For comfort Precaution Booklet Issued: No Precaution Comments: reviewed back precautions, pt able to recall BLT Required Braces or Orthoses:  Cervical Brace, Spinal Brace Cervical Brace: Hard collar (donned in sitting for OOB mobility) Spinal Brace: Thoracolumbosacral orthotic, Applied in sitting position Restrictions Weight Bearing Restrictions: Yes RUE Weight Bearing: Weight bearing as tolerated Other Position/Activity Restrictions: sling for comfort       Therapy/Group: Individual Therapy   Peter Congo, PT, DPT, CSRS 09/13/2021, 3:00 PM

## 2021-09-13 NOTE — Progress Notes (Signed)
Occupational Therapy Session Note  Patient Details  Name: Justin Bauer MRN: [id number] Date of Birth: 06-Jan-1961  Today's Date: 09/13/2021 OT Individual Time: 7035-0093 OT Individual Time Calculation (min): 40 min    Short Term Goals: Week 1:  OT Short Term Goal 1 (Week 1): Pt to be Min A for LB ADLs with appropriate AE OT Short Term Goal 2 (Week 1): Pt to be SBA for toileting tasks OT Short Term Goal 3 (Week 1): Pt will demonstrate improved use of the R UE during funcitonal tasks OT Short Term Goal 4 (Week 1): Pt to be Supervision for functional transfers  Skilled Therapeutic Interventions/Progress Updates:    Patient received reclined supine in bed - TLSO off.  Patient assisted up allowed to change into clean shirt and don TLSO.  Patient able to self direct.  Patient with order for Cervical collar to be worn when up as of today.  Cervical collar and TED hose donned.  Patient with warm, swollen right wrist - reports much better than yesterday - but still reports pain 7/10.  Ace wrapped right wrist for some compression and support.  Worked on gentle AAROM to right shoulder in modified closed chain conditions - sliding hand along leg - as well as reviewing shoulder shrugs and scapular adduction.  Worked on active elbow flexion and extension.   Patient left up in wheelchair - with handoff to PT.    Therapy Documentation Precautions:  Precautions Precautions: Back, Fall, Shoulder Type of Shoulder Precautions: sling for comfort, WBAT Shoulder Interventions: Shoulder sling/immobilizer, For comfort Precaution Booklet Issued: No Precaution Comments: reviewed back precautions, pt able to recall BLT Required Braces or Orthoses: Cervical Brace, Spinal Brace Cervical Brace: Hard collar (donned in sitting for OOB mobility) Spinal Brace: Thoracolumbosacral orthotic, Applied in sitting position Restrictions Weight Bearing Restrictions: Yes RUE Weight Bearing: Weight bearing as tolerated Other  Position/Activity Restrictions: sling for comfort  Pain: Pain Assessment Pain Scale: 0-10 Pain Score: 7/10 right wrist     Therapy/Group: Individual Therapy  Collier Salina 09/13/2021, 3:42 PM

## 2021-09-13 NOTE — Progress Notes (Signed)
Occupational Therapy Session Note  Patient Details  Name: Justin Bauer MRN: [id number] Date of Birth: 1960/10/04  Today's Date: 09/13/2021 OT Individual Time: 1300-1415 OT Individual Time Calculation (min): 75 min    Short Term Goals: Week 1:  OT Short Term Goal 1 (Week 1): Pt to be Min A for LB ADLs with appropriate AE OT Short Term Goal 2 (Week 1): Pt to be SBA for toileting tasks OT Short Term Goal 3 (Week 1): Pt will demonstrate improved use of the R UE during funcitonal tasks OT Short Term Goal 4 (Week 1): Pt to be Supervision for functional transfers  Skilled Therapeutic Interventions/Progress Updates:   Pt awaiting OT in bed eager for shower. Reports significant reduction in R wrist pain this day.  Pt now to wear TLSO and Aspen collar when OOB. OT did not have clarification for braces off during shower thus pt agreeable to wear during showers until MD clarification. Pt had been ok'd for showers in MD notes but no clarification re: braces. Pt requires OT assist for braces and amb with close S to toilet with 3 in 1 over top. Pt able to have BM and urine output and OT documented on Flowsheet for team communication. Pt able to perform peri hygiene with min cues for spinal precautions. Pt transferred to shower bench with grab bar with close S. UB bathing and hair washing with min A and LB self care with mod A. Sink side dressing with UB dressing with pull over shirt with hemi techniques and CGA and LB dressing using reacher with min A.Pt transferred back to bed as spinal braces needed removal for drying. OT spoke briefly on speaker phone with pt with pt's dtr and sister who will come in for family education tomorrow. Pt was assisted to elevate and ice r wrist and OT left pt at end of session with bed exit engaged and call bell and needs within reach. Pain overall 4/10 R UE.    Addendum: OT secure chatted MD after session for clarity for next visit: "OK to shower with Aspen and TLSO on for  transfer to shower bench, removed for shower, replace prior to transfer" as per Dr Laurena Bering.   Therapy Documentation Precautions:  Precautions Precautions: Back, Fall, Shoulder Type of Shoulder Precautions: sling for comfort, WBAT Shoulder Interventions: Shoulder sling/immobilizer, For comfort Precaution Booklet Issued: No Precaution Comments: reviewed back precautions, pt able to recall BLT Required Braces or Orthoses: Cervical Brace, Spinal Brace Cervical Brace: Hard collar (donned in sitting for OOB mobility) Spinal Brace: Thoracolumbosacral orthotic, Applied in sitting position Restrictions Weight Bearing Restrictions: Yes RUE Weight Bearing: Weight bearing as tolerated Other Position/Activity Restrictions: sling for comfort    Therapy/Group: Individual Therapy  Vicenta Dunning 09/13/2021, 8:00 PM
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
## 2021-09-16 ENCOUNTER — Inpatient Hospital Stay (HOSPITAL_COMMUNITY): Payer: No Typology Code available for payment source

## 2021-09-16 NOTE — Progress Notes (Addendum)
Physical Therapy Session Note  Patient Details  Name: Justin Bauer MRN: [id number] Date of Birth: 04/22/1960  Today's Date: 09/16/2021 PT Individual Time: 1410-1514 PT Individual Time Calculation (min): 64 min   Short Term Goals: Week 1:  PT Short Term Goal 1 (Week 1): =LTG due to ELOS      Skilled Therapeutic Interventions/Progress Updates:  Pt resting in recliner.  He rated R hand pain (gout) 6/10, premedicated.  Sit> stand with close supervision.  Gait training without AD, CG x 150' on level tile.  PT noted that TLSO and cervical collar needed to be adjusted.  With mirror feedback, pt able to describe how to correct positioning of cervical collar, but unable to pull straps tightly enough; limited by R hand pain and swelling.    Neuromuscular re-education via demo, multimodal cues and mirror for use of Kinetron at level 30 cm/sec x 2 min in sitting, x 5 min in standing, with> without use of UEs for alternating reciprocal movement and to re-train wt shifting.  Pt required min assist consistently for full wt shift to L.  Balance challenge and sustained stretch bil heel cords and hamstrings, standing with forefeet on blue wedge, while using R hand to manipulate playing cards at chest height, x 2 minutes.  Pt had slight drift posteriorly when initially standing on wedge, which he self corrected.  Therapeutic exercises performed with LEs to increase strength for functional mobility. With bil UE support, 15 x 1 mini squats, 12 x 1 bil heel/toe raises; with 1 UE support, single limb stance 12 x 1 each R/L hip flexion.  Pt reported that his L posterior knee is fatigued after extensive ambulation with OT earlier in day.  PT instructed pt in self -stretching R/L hamstrings, using foot stool.  He stretched x 30 seconds x 3 R/L, with good understanding of technique.   Advanced gait training on level tile,  transporting bulky object with bil hands holding it to his chest,  x 150' with CGA.  Pt quite  fatigued after this.  Ambulatory transfer to bed.  Sister present in room. PT doffed cervical collar and TLSO.  Supervision for sit> supine. PT provided a small towel roll which pt requested for cervical positioning.  This PT also discussed with sister possible use of a soft "airplane pillow"  for this.  At end of session, bed alarm set, needs at hand and sister present.     Therapy Documentation Precautions:  Precautions Precautions: Back, Fall, Shoulder Type of Shoulder Precautions: sling for comfort, WBAT Shoulder Interventions: Shoulder sling/immobilizer, For comfort Precaution Booklet Issued: No Precaution Comments: reviewed back precautions, pt able to recall BLT Required Braces or Orthoses: Cervical Brace, Spinal Brace Cervical Brace: Hard collar (donned in sitting for OOB mobility) Spinal Brace: Thoracolumbosacral orthotic, Applied in sitting position Restrictions Weight Bearing Restrictions: Yes RUE Weight Bearing: Weight bearing as tolerated Other Position/Activity Restrictions: sling for comfort    Therapy/Group: Individual Therapy  [name] 09/16/2021, 4:50 PM
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
## 2021-09-18 ENCOUNTER — Other Ambulatory Visit (HOSPITAL_COMMUNITY): Payer: Self-pay

## 2021-09-18 ENCOUNTER — Inpatient Hospital Stay (HOSPITAL_COMMUNITY): Payer: No Typology Code available for payment source

## 2021-09-18 LAB — CBC WITH DIFFERENTIAL/PLATELET
Abs Immature Granulocytes: 0.04 10*3/uL (ref 0.00–0.07)
Basophils Absolute: 0.1 10*3/uL (ref 0.0–0.1)
Basophils Relative: 1 %
Eosinophils Absolute: 0.2 10*3/uL (ref 0.0–0.5)
Eosinophils Relative: 2 %
HCT: 33.3 % — ABNORMAL LOW (ref 39.0–52.0)
Hemoglobin: 11.2 g/dL — ABNORMAL LOW (ref 13.0–17.0)
Immature Granulocytes: 1 %
Lymphocytes Relative: 25 %
Lymphs Abs: 1.6 10*3/uL (ref 0.7–4.0)
MCH: 32.2 pg (ref 26.0–34.0)
MCHC: 33.6 g/dL (ref 30.0–36.0)
MCV: 95.7 fL (ref 80.0–100.0)
Monocytes Absolute: 0.9 10*3/uL (ref 0.1–1.0)
Monocytes Relative: 14 %
Neutro Abs: 3.7 10*3/uL (ref 1.7–7.7)
Neutrophils Relative %: 57 %
Platelets: 410 10*3/uL — ABNORMAL HIGH (ref 150–400)
RBC: 3.48 MIL/uL — ABNORMAL LOW (ref 4.22–5.81)
RDW: 14.7 % (ref 11.5–15.5)
WBC: 6.5 10*3/uL (ref 4.0–10.5)
nRBC: 0 % (ref 0.0–0.2)

## 2021-09-18 LAB — BASIC METABOLIC PANEL
Anion gap: 5 (ref 5–15)
BUN: 14 mg/dL (ref 8–23)
CO2: 27 mmol/L (ref 22–32)
Calcium: 8.7 mg/dL — ABNORMAL LOW (ref 8.9–10.3)
Chloride: 104 mmol/L (ref 98–111)
Creatinine, Ser: 0.7 mg/dL (ref 0.61–1.24)
GFR, Estimated: >60 mL/min (ref >60)
Glucose, Bld: 92 mg/dL (ref 70–99)
Potassium: 4.5 mmol/L (ref 3.5–5.1)
Sodium: 136 mmol/L (ref 135–145)

## 2021-09-18 MED ORDER — MELATONIN 3 MG PO TABS
3.0000 mg | ORAL_TABLET | Freq: Every day | ORAL | 0 refills | Status: DC
  Filled 2021-09-18: qty 30, 30d supply, fill #0

## 2021-09-18 MED ORDER — TAMSULOSIN HCL 0.4 MG PO CAPS
0.4000 mg | ORAL_CAPSULE | Freq: Every day | ORAL | 0 refills | Status: DC
Start: 2021-09-18 — End: 2022-04-02
  Filled 2021-09-18: qty 30, 30d supply, fill #0

## 2021-09-18 MED ORDER — GABAPENTIN 300 MG PO CAPS
600.0000 mg | ORAL_CAPSULE | Freq: Four times a day (QID) | ORAL | 0 refills | Status: DC
  Filled 2021-09-18: qty 240, 30d supply, fill #0

## 2021-09-18 MED ORDER — MELATONIN 3 MG PO TABS
3.0000 mg | ORAL_TABLET | Freq: Every day | ORAL | 0 refills | Status: DC
Start: 2021-09-18 — End: 2021-09-18
  Filled 2021-09-18: qty 30, 30d supply, fill #0

## 2021-09-18 MED ORDER — POLYETHYLENE GLYCOL 3350 17 G PO PACK
17.0000 g | PACK | Freq: Two times a day (BID) | ORAL | 0 refills | Status: DC
Start: 2021-09-18 — End: 2021-10-04

## 2021-09-18 MED ORDER — METHOCARBAMOL 500 MG PO TABS
1000.0000 mg | ORAL_TABLET | Freq: Three times a day (TID) | ORAL | 0 refills | Status: DC
Start: 2021-09-18 — End: 2022-04-02
  Filled 2021-09-18: qty 180, 30d supply, fill #0

## 2021-09-18 MED ORDER — LIDOCAINE 5 % EX PTCH
1.0000 | MEDICATED_PATCH | CUTANEOUS | 0 refills | Status: DC
  Filled 2021-09-18: qty 30, 30d supply, fill #0

## 2021-09-18 MED ORDER — OXYCODONE HCL 10 MG PO TABS
10.0000 mg | ORAL_TABLET | ORAL | 0 refills | Status: DC | PRN
  Filled 2021-09-18: qty 30, 4d supply, fill #0

## 2021-09-18 MED ORDER — METHOCARBAMOL 1000 MG PO TABS
1000.0000 mg | ORAL_TABLET | Freq: Three times a day (TID) | ORAL | 0 refills | Status: DC
  Filled 2021-09-18: qty 90, fill #0

## 2021-09-18 MED ORDER — GABAPENTIN 300 MG PO CAPS
600.0000 mg | ORAL_CAPSULE | Freq: Four times a day (QID) | ORAL | 0 refills | Status: DC
Start: 2021-09-18 — End: 2021-09-18
  Filled 2021-09-18: qty 240, 30d supply, fill #0

## 2021-09-18 MED ORDER — DOCUSATE SODIUM 100 MG PO CAPS: 100.0000 mg | ORAL_CAPSULE | Freq: Two times a day (BID) | ORAL | 0 refills | Status: DC

## 2021-09-18 MED ORDER — ACETAMINOPHEN 325 MG PO TABS: 325.0000 mg | ORAL_TABLET | ORAL | Status: AC | PRN

## 2021-09-18 MED ORDER — TAMSULOSIN HCL 0.4 MG PO CAPS
0.4000 mg | ORAL_CAPSULE | Freq: Every day | ORAL | 0 refills | Status: DC
  Filled 2021-09-18: qty 30, 30d supply, fill #0

## 2021-09-18 NOTE — Progress Notes (Addendum)
Physical Therapy Discharge Summary  Patient Details  Name: Justin Bauer MRN: [id number] Date of Birth: [birth year]  Today's Date: 09/18/2021 PT Individual Time: 1400-1455 PT Individual Time Calculation (min): 55 min    Patient has met 7 of 7 long term goals due to improved activity tolerance, improved balance, improved postural control, increased strength, ability to compensate for deficits, improved attention, improved awareness, and improved coordination.  Patient to discharge at an ambulatory level Modified Independent.   Patient's care partner is independent to provide the necessary physical assistance at discharge. Pt to d/c home with his sister who has undergone hands on family training.   Reasons goals not met: NA  Recommendation:  Patient will benefit from ongoing skilled PT services in home health setting to continue to advance safe functional mobility, address ongoing impairments in balance, endurance, coordination, and minimize fall risk.  Equipment: No equipment provided  Reasons for discharge: treatment goals met and discharge from hospital  Patient/family agrees with progress made and goals achieved: Yes  Skilled Therapeutic Interventions/Progress Updates: pt received in bed and agreeable to therapy. Pt reports 5/10 pain in wrist and shoulder. Sit>supine mod I, no cueing required. Donned C collar and TLSO with assist. Pt request to change clothes, so pt ambulated to bathroom and changed pants with distant supervision. Mod A for shoes. Pt ambulated throughout session mod I. Discussed walking and slowly and using RW or requesting supervision for longer distances or new/busy environments. Discussed d/c plans and performed motor/sensory testing BERG balance scale as documented below, discussed results. Pt then performed 4 x 12 squats with 10lb dumbbell with visual feedback for equal weight bearing BIL. Pt then returned to room. Therapist assured all pt's and family members  questions had been answered, pt remained in w/c with his family present.   PT Discharge Precautions/Restrictions Precautions Precautions: Back;Fall;Shoulder Type of Shoulder Precautions: sling for comfort, WBAT Shoulder Interventions: Shoulder sling/immobilizer;For comfort Precaution Booklet Issued: No Precaution Comments: reviewed back precautions, pt able to recall BLT Required Braces or Orthoses: Cervical Brace;Spinal Brace Cervical Brace: Hard collar Spinal Brace: Thoracolumbosacral orthotic;Applied in sitting position Restrictions Weight Bearing Restrictions: Yes RUE Weight Bearing: Weight bearing as tolerated Vital Signs Therapy Vitals Temp: 98 F (36.7 C) Pulse Rate: 95 Resp: 20 BP: 121/85 Patient Position (if appropriate): Lying Oxygen Therapy SpO2: 96 % O2 Device: Room Air Pain Pain Assessment Pain Score: 0-No pain Pain Interference Pain Interference Pain Effect on Sleep: 3. Frequently Pain Interference with Therapy Activities: 1. Rarely or not at all Pain Interference with Day-to-Day Activities: 1. Rarely or not at all Vision/Perception  Vision - History Ability to See in Adequate Light: 0 Adequate Perception Perception: Within Functional Limits Praxis Praxis: Intact  Cognition Overall Cognitive Status: Within Functional Limits for tasks assessed Arousal/Alertness: Awake/alert Orientation Level: Oriented X4 Year: 2023 Month: June Day of Week: Correct Attention: Sustained;Selective;Alternating Focused Attention: Appears intact Sustained Attention: Appears intact Selective Attention: Appears intact Selective Attention Impairment: Functional complex;Verbal complex Alternating Attention: Appears intact Memory: Appears intact Awareness: Appears intact Awareness Impairment: Anticipatory impairment Problem Solving: Appears intact Problem Solving Impairment: Functional complex;Verbal basic Safety/Judgment: Appears intact Sensation Sensation Light  Touch: Impaired Detail Light Touch Impaired Details: Impaired RUE Proprioception: Appears Intact Additional Comments: numbness to R hand Coordination Gross Motor Movements are Fluid and Coordinated: No Coordination and Movement Description: impaired 2/2 RUE pain, precautions, improved from baseline Motor  Motor Motor: Abnormal postural alignment and control Motor - Skilled Clinical Observations: impaired 2/2 pain, precautions, TLSO Motor - Discharge Observations:  Greatly improved from baseline  Mobility Bed Mobility Bed Mobility: Rolling Right;Rolling Left;Supine to Sit;Sit to Supine Rolling Right: Independent with assistive device Rolling Left: Independent with assistive device Supine to Sit: Independent with assistive device Sit to Supine: Independent with assistive device Transfers Transfers: Sit to Stand;Stand Pivot Transfers Sit to Stand: Independent with assistive device Stand to Sit: Independent with assistive device Stand Pivot Transfers: Independent with assistive device Transfer (Assistive device): None Locomotion  Gait Ambulation: Yes Gait Assistance: Independent with assistive device Gait Distance (Feet): 200 Feet Assistive device: None Gait Gait: Yes Gait Pattern: Impaired (mildly ataxic) Gait velocity: decreased Stairs / Additional Locomotion Stairs: Yes Stairs Assistance: Independent with assistive device Stair Management Technique: One rail Left;Step to pattern;Alternating pattern Number of Stairs: 12 Height of Stairs: 6  Trunk/Postural Assessment  Cervical Assessment Cervical Assessment: Exceptions to [facility] Thoracic Assessment Thoracic Assessment: Exceptions to [facility] Lumbar Assessment Lumbar Assessment: Exceptions to [facility] Postural Control Postural Control: Within Functional Limits Righting Reactions: delayed  Balance Balance Balance Assessed: Yes Standardized Balance Assessment Standardized Balance Assessment: Berg Balance Test Berg Balance  Test Sit to Stand: Able to stand without using hands and stabilize independently Standing Unsupported: Able to stand safely 2 minutes Sitting with Back Unsupported but Feet Supported on Floor or Stool: Able to sit safely and securely 2 minutes Stand to Sit: Sits safely with minimal use of hands Transfers: Able to transfer safely, minor use of hands Standing Unsupported with Eyes Closed: Able to stand 10 seconds safely Standing Ubsupported with Feet Together: Able to place feet together independently and stand 1 minute safely From Standing, Reach Forward with Outstretched Arm: Can reach forward >12 cm safely (5") From Standing Position, Pick up Object from Floor: Able to pick up shoe, needs supervision From Standing Position, Turn to Look Behind Over each Shoulder: Looks behind one side only/other side shows less weight shift (limited by TLSO and C collar) Turn 360 Degrees: Able to turn 360 degrees safely in 4 seconds or less Standing Unsupported, Alternately Place Feet on Step/Stool: Able to stand independently and safely and complete 8 steps in 20 seconds Standing Unsupported, One Foot in Front: Able to place foot tandem independently and hold 30 seconds Standing on One Leg: Able to lift leg independently and hold equal to or more than 3 seconds Total Score: 51 Static Sitting Balance Static Sitting - Balance Support: No upper extremity supported;Feet supported Static Sitting - Level of Assistance: 6: Modified independent (Device/Increase time) Dynamic Sitting Balance Dynamic Sitting - Balance Support: No upper extremity supported;Feet supported;During functional activity Dynamic Sitting - Level of Assistance: 6: Modified independent (Device/Increase time) Dynamic Sitting - Balance Activities: Reaching for objects Static Standing Balance Static Standing - Balance Support: No upper extremity supported;During functional activity Static Standing - Level of Assistance: 6: Modified independent  (Device/Increase time) Dynamic Standing Balance Dynamic Standing - Balance Support: No upper extremity supported;During functional activity Dynamic Standing - Level of Assistance: 6: Modified independent (Device/Increase time) Extremity Assessment      RLE Assessment RLE Assessment: Within Functional Limits General Strength Comments: 4+/5 to 5/5 grossly LLE Assessment LLE Assessment: Within Functional Limits General Strength Comments: 5/5 grossly    Juluis Rainier 09/18/2021, 2:39 PM

## 2021-09-18 NOTE — Progress Notes (Signed)
Inpatient Rehabilitation Care Coordinator Discharge Note   Patient Details  Name: Justin Bauer MRN: [id number] Date of Birth: Apr 20, 1960   Discharge location: GOING TO SISTER'S HOME UNTIL HE IS INDEPENDENT THEN GOING BACK TO HIS HOME  Length of Stay: 11 DAYS  Discharge activity level: SUPERVISION LEVEL  Home/community participation: ACTIVE  Patient response RJ:JOACZY Literacy - How often do you need to have someone help you when you read instructions, pamphlets, or other written material from your doctor or pharmacy?: Never  Patient response SA:YTKZSW Isolation - How often do you feel lonely or isolated from those around you?: Rarely  Services provided included: MD, RD, PT, OT, SLP, RN, CM, TR, Pharmacy, SW  Financial Services:  Field seismologist Utilized: Dealer  Choices offered to/list presented to: PT  Follow-up services arranged:  Home Health, DME, Patient/Family has no preference for HH/DME agencies Home Health Agency: [facility]  PT & OT    [facility] ALREAD[facility] RECEIVED 3 IN 1 FROM [place]    Patient response to transportation need: Is the patient able to respond to transportation needs?: Yes In the past 12 months, has lack of transportation kept you from medical appointments or from getting medications?: Yes In the past 12 months, has lack of transportation kept you from meetings, work, or from getting things needed for daily living?: Yes    Comments (or additional information):SISTER'S WERE IN FOR EDUCATION FEEL COMFORTABLE WITH CARE NEEDS AND READY FOR DC HOME.  Patient/Family verbalized understanding of follow-up arrangements:  Yes  Individual responsible for coordination of the follow-up plan: SELF AND THERESA-SISTER [phone]  Confirmed correct DME delivered: Justin Bauer 09/18/2021    Justin Bauer, Justin Bauer

## 2021-09-18 NOTE — Plan of Care (Signed)
  Problem: RH Balance Goal: LTG: Patient will maintain dynamic sitting balance (OT) Description: LTG:  Patient will maintain dynamic sitting balance with assistance during activities of daily living (OT) Outcome: Completed/Met Goal: LTG Patient will maintain dynamic standing with ADLs (OT) Description: LTG:  Patient will maintain dynamic standing balance with assist during activities of daily living (OT)  Outcome: Completed/Met   Problem: Sit to Stand Goal: LTG:  Patient will perform sit to stand in prep for activites of daily living with assistance level (OT) Description: LTG:  Patient will perform sit to stand in prep for activites of daily living with assistance level (OT) Outcome: Completed/Met   Problem: RH Grooming Goal: LTG Patient will perform grooming w/assist,cues/equip (OT) Description: LTG: Patient will perform grooming with assist, with/without cues using equipment (OT) Outcome: Completed/Met   Problem: RH Bathing Goal: LTG Patient will bathe all body parts with assist levels (OT) Description: LTG: Patient will bathe all body parts with assist levels (OT) Outcome: Completed/Met   Problem: RH Dressing Goal: LTG Patient will perform upper body dressing (OT) Description: LTG Patient will perform upper body dressing with assist, with/without cues (OT). Outcome: Completed/Met Goal: LTG Patient will perform lower body dressing w/assist (OT) Description: LTG: Patient will perform lower body dressing with assist, with/without cues in positioning using equipment (OT) Outcome: Completed/Met   Problem: RH Toileting Goal: LTG Patient will perform toileting task (3/3 steps) with assistance level (OT) Description: LTG: Patient will perform toileting task (3/3 steps) with assistance level (OT)  Outcome: Completed/Met   Problem: RH Functional Use of Upper Extremity Goal: LTG Patient will use RT/LT upper extremity as a (OT) Description: LTG: Patient will use right/left upper  extremity as a stabilizer/gross assist/diminished/nondominant/dominant level with assist, with/without cues during functional activity (OT) Outcome: Completed/Met   Problem: RH Simple Meal Prep Goal: LTG Patient will perform simple meal prep w/assist (OT) Description: LTG: Patient will perform simple meal prep with assistance, with/without cues (OT). Outcome: Completed/Met   Problem: RH Light Housekeeping Goal: LTG Patient will perform light housekeeping w/assist (OT) Description: LTG: Patient will perform light housekeeping with assistance, with/without cues (OT). Outcome: Completed/Met   Problem: RH Toilet Transfers Goal: LTG Patient will perform toilet transfers w/assist (OT) Description: LTG: Patient will perform toilet transfers with assist, with/without cues using equipment (OT) Outcome: Completed/Met   Problem: RH Tub/Shower Transfers Goal: LTG Patient will perform tub/shower transfers w/assist (OT) Description: LTG: Patient will perform tub/shower transfers with assist, with/without cues using equipment (OT) Outcome: Completed/Met   Problem: RH Memory Goal: LTG Patient will demonstrate ability for day to day recall/carry over during activities of daily living with assistance level (OT) Description: LTG:  Patient will demonstrate ability for day to day recall/carry over during activities of daily living with assistance level (OT). Outcome: Completed/Met

## 2021-09-18 NOTE — Progress Notes (Signed)
Patient ID: Justin Bauer, male   DOB: 11/11/1960, 61 y.o.   MRN: [id number]  Met with pt to give team conference update progress toward reaching his goals of supervision level and discharge tomorrow. He feels ready and is going to sister's home. Both sister's have been in for family training and feel comfortable with his care needs. Pt feels ready to go home tomorrow. No other equipment needs and home health arranged.

## 2021-09-19 ENCOUNTER — Other Ambulatory Visit (HOSPITAL_COMMUNITY): Payer: Self-pay

## 2021-09-19 NOTE — Progress Notes (Signed)
INPATIENT REHABILITATION DISCHARGE NOTE   Discharge instructions by: Jesusita Oka PA  Verbalized understanding:yes  Skin care/Wound care healing?none   Pain:none  IV's:none  Tubes/Drains:none   O2:none  Safety instructions:done  Patient belongings:done  Discharged to: home  Discharged ZOX:[id number]/ [id number]  Notes: Kaitlin Alcindor RNC,BSN, WTA
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
## 2021-09-26 ENCOUNTER — Emergency Department (HOSPITAL_BASED_OUTPATIENT_CLINIC_OR_DEPARTMENT_OTHER)
Admission: EM | Admit: 2021-09-26 | Discharge: 2021-09-26 | Disposition: A | Discharge status: Home / Self Care | Payer: No Typology Code available for payment source | Attending: Emergency Medicine | Admitting: Emergency Medicine

## 2021-09-26 ENCOUNTER — Telehealth: Payer: Self-pay

## 2021-09-26 ENCOUNTER — Other Ambulatory Visit (HOSPITAL_BASED_OUTPATIENT_CLINIC_OR_DEPARTMENT_OTHER): Payer: Self-pay

## 2021-09-26 ENCOUNTER — Emergency Department (HOSPITAL_BASED_OUTPATIENT_CLINIC_OR_DEPARTMENT_OTHER): Payer: No Typology Code available for payment source

## 2021-09-26 ENCOUNTER — Other Ambulatory Visit: Payer: Self-pay

## 2021-09-26 ENCOUNTER — Encounter (HOSPITAL_BASED_OUTPATIENT_CLINIC_OR_DEPARTMENT_OTHER): Payer: Self-pay

## 2021-09-26 DIAGNOSIS — M79641 Pain in right hand: Secondary | ICD-10-CM | POA: Diagnosis not present

## 2021-09-26 DIAGNOSIS — M7989 Other specified soft tissue disorders: Secondary | ICD-10-CM | POA: Insufficient documentation

## 2021-09-26 MED ORDER — OXYCODONE HCL 5 MG PO TABS
5.0000 mg | ORAL_TABLET | ORAL | 0 refills | Status: DC | PRN
  Filled 2021-09-26: qty 30, 5d supply, fill #0

## 2021-09-26 MED ORDER — PREDNISONE 20 MG PO TABS
40.0000 mg | ORAL_TABLET | Freq: Every day | ORAL | 0 refills | Status: DC
  Filled 2021-09-26: qty 10, 5d supply, fill #0

## 2021-09-26 MED ORDER — PREDNISONE 20 MG PO TABS: 40.0000 mg | ORAL_TABLET | Freq: Every day | ORAL | 0 refills | Status: AC

## 2021-09-26 MED ORDER — OXYCODONE HCL 5 MG PO TABS: 5.0000 mg | ORAL_TABLET | ORAL | 0 refills | Status: DC | PRN

## 2021-09-26 NOTE — Telephone Encounter (Signed)
Thanks for FYI- ML

## 2021-09-26 NOTE — Telephone Encounter (Signed)
Mr. Harkless called to report right hand pain with swelling & warmth.  Per patient he called Dr. Jena Gauss office for follow up and cannot get in for 2 weeks.     Per discharge note the area has gout.  Patient has been advised to try to get in to see Dr. Jena Gauss PA or NP. He then stated he will go to the [facility] today for follow up. Urgent Care also advised.
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
## 2021-09-26 NOTE — Discharge Instructions (Signed)
Overall suspect that your pain is from inflammation.  Follow-up with hand team.  Take steroids as prescribed.  Take Roxicodone for breakthrough pain.  This is a narcotic pain medicine so do not mix with alcohol or drugs or other dangerous activities.

## 2021-10-04 ENCOUNTER — Encounter: Payer: No Typology Code available for payment source | Attending: Registered Nurse | Admitting: Registered Nurse

## 2021-10-04 ENCOUNTER — Encounter: Payer: Self-pay | Admitting: Registered Nurse

## 2021-10-04 VITALS — BP 145/75 | HR 97 | Ht 75.0 in | Wt 198.0 lb

## 2021-10-04 DIAGNOSIS — M79641 Pain in right hand: Secondary | ICD-10-CM | POA: Insufficient documentation

## 2021-10-04 DIAGNOSIS — Z5181 Encounter for therapeutic drug level monitoring: Secondary | ICD-10-CM | POA: Diagnosis present

## 2021-10-04 DIAGNOSIS — T07XXXA Unspecified multiple injuries, initial encounter: Secondary | ICD-10-CM | POA: Diagnosis not present

## 2021-10-04 DIAGNOSIS — Z79899 Other long term (current) drug therapy: Secondary | ICD-10-CM | POA: Diagnosis present

## 2021-10-04 DIAGNOSIS — G894 Chronic pain syndrome: Secondary | ICD-10-CM | POA: Insufficient documentation

## 2021-10-04 MED ORDER — OXYCODONE HCL 5 MG PO TABS: 5.0000 mg | ORAL_TABLET | Freq: Three times a day (TID) | ORAL | 0 refills | Status: DC | PRN
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
## 2021-10-09 LAB — DRUG TOX MONITOR 1 W/CONF, ORAL FLD
Amphetamines: NEGATIVE ng/mL (ref <10)
Barbiturates: NEGATIVE ng/mL (ref <10)
Benzodiazepines: NEGATIVE ng/mL (ref <0.50)
Benzoylecgonine: 10.2 ng/mL — ABNORMAL HIGH (ref <5.0)
Buprenorphine: NEGATIVE ng/mL (ref <0.10)
Cocaine: 10.6 ng/mL — ABNORMAL HIGH (ref <5.0)
Cocaine: POSITIVE ng/mL — AB (ref <5.0)
Codeine: NEGATIVE ng/mL (ref <2.5)
Cotinine: 27.2 ng/mL — ABNORMAL HIGH (ref <5.0)
Dihydrocodeine: NEGATIVE ng/mL (ref <2.5)
Fentanyl: NEGATIVE ng/mL (ref <0.10)
Heroin Metabolite: NEGATIVE ng/mL (ref <1.0)
Hydrocodone: NEGATIVE ng/mL (ref <2.5)
Hydromorphone: NEGATIVE ng/mL (ref <2.5)
MARIJUANA: NEGATIVE ng/mL (ref <2.5)
MDMA: NEGATIVE ng/mL (ref <10)
Meprobamate: NEGATIVE ng/mL (ref <2.5)
Methadone: NEGATIVE ng/mL (ref <5.0)
Morphine: NEGATIVE ng/mL (ref <2.5)
Nicotine Metabolite: POSITIVE ng/mL — AB (ref <5.0)
Norhydrocodone: NEGATIVE ng/mL (ref <2.5)
Noroxycodone: 7 ng/mL — ABNORMAL HIGH (ref <2.5)
Opiates: POSITIVE ng/mL — AB (ref <2.5)
Oxycodone: 35.4 ng/mL — ABNORMAL HIGH (ref <2.5)
Oxymorphone: NEGATIVE ng/mL (ref <2.5)
Phencyclidine: NEGATIVE ng/mL (ref <10)
Tapentadol: NEGATIVE ng/mL (ref <5.0)
Tramadol: NEGATIVE ng/mL (ref <5.0)
Zolpidem: NEGATIVE ng/mL (ref <5.0)

## 2021-10-09 LAB — DRUG TOX ALC METAB W/CON, ORAL FLD: Alcohol Metabolite: NEGATIVE ng/mL (ref <25)

## 2021-10-10 ENCOUNTER — Telehealth: Payer: Self-pay | Admitting: *Deleted

## 2021-10-10 NOTE — Telephone Encounter (Signed)
Oral drug screen is positive for cocaine.

## 2021-10-15 NOTE — Telephone Encounter (Signed)
Cannot give any more meds- please let pt know he can be seen for uncontrolled med regimen/treatment- but nothing controlled- ML

## 2021-10-16 NOTE — Telephone Encounter (Signed)
Letter sent through [facility] and [employer] informing of non narcotic treatment. [facility] information given.

## 2021-11-15 ENCOUNTER — Other Ambulatory Visit (HOSPITAL_COMMUNITY): Payer: Self-pay

## 2022-01-28 ENCOUNTER — Encounter: Payer: No Typology Code available for payment source | Admitting: Physical Medicine and Rehabilitation

## 2022-03-22 ENCOUNTER — Encounter: Payer: Self-pay | Admitting: Neurology
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
## 2022-04-01 ENCOUNTER — Ambulatory Visit: Payer: No Typology Code available for payment source | Admitting: Neurology

## 2022-04-02 ENCOUNTER — Ambulatory Visit (INDEPENDENT_AMBULATORY_CARE_PROVIDER_SITE_OTHER): Payer: No Typology Code available for payment source | Admitting: Neurology

## 2022-04-02 ENCOUNTER — Encounter: Payer: Self-pay | Admitting: Neurology

## 2022-04-02 VITALS — BP 138/81 | HR 96 | Ht 74.0 in | Wt 176.0 lb

## 2022-04-02 DIAGNOSIS — S143XXS Injury of brachial plexus, sequela: Secondary | ICD-10-CM

## 2022-04-02 DIAGNOSIS — R202 Paresthesia of skin: Secondary | ICD-10-CM

## 2022-04-02 DIAGNOSIS — F1010 Alcohol abuse, uncomplicated: Secondary | ICD-10-CM | POA: Diagnosis not present

## 2022-04-02 DIAGNOSIS — R200 Anesthesia of skin: Secondary | ICD-10-CM

## 2022-04-02 DIAGNOSIS — M62521 Muscle wasting and atrophy, not elsewhere classified, right upper arm: Secondary | ICD-10-CM

## 2022-04-02 DIAGNOSIS — R29898 Other symptoms and signs involving the musculoskeletal system: Secondary | ICD-10-CM

## 2022-04-02 NOTE — Patient Instructions (Signed)
I think you have injured nerves in your right arm that is causing your symptoms. You may have injured a plexus of nerves in your right shoulder area called the brachial plexus.  To get more information, I would like to do a nerve test called an EMG (see below for more information).  I will be in touch when I have your results.  Continue your home exercises given by PT.  Abstain from alcohol as able.  Return to clinic in 6 months.  The physicians and staff at [facility] are committed to providing excellent care. You may receive a survey requesting feedback about your experience at our office. We strive to receive "very good" responses to the survey questions. If you feel that your experience would prevent you from giving the office a "very good " response, please contact our office to try to remedy the situation. We may be reached at [phone]. Thank you for taking the time out of your busy day to complete the survey.  Kai Levins, MD [facility]  ELECTROMYOGRAM AND NERVE CONDUCTION STUDIES (EMG/NCS) INSTRUCTIONS  How to Prepare The neurologist conducting the EMG will need to know if you have certain medical conditions. Tell the neurologist and other EMG lab personnel if you: Have a pacemaker or any other electrical medical device Take blood-thinning medications Have hemophilia, a blood-clotting disorder that causes prolonged bleeding Bathing Take a shower or bath shortly before your exam in order to remove oils from your skin. Don't apply lotions or creams before the exam.  What to Expect You'll likely be asked to change into a hospital gown for the procedure and lie down on an examination table. The following explanations can help you understand what will happen during the exam.  Electrodes. The neurologist or a technician places surface electrodes at various locations on your skin depending on where you're experiencing symptoms. Or the neurologist may insert needle  electrodes at different sites depending on your symptoms.  Sensations. The electrodes will at times transmit a tiny electrical current that you may feel as a twinge or spasm. The needle electrode may cause discomfort or pain that usually ends shortly after the needle is removed. If you are concerned about discomfort or pain, you may want to talk to the neurologist about taking a short break during the exam.  Instructions. During the needle EMG, the neurologist will assess whether there is any spontaneous electrical activity when the muscle is at rest - activity that isn't present in healthy muscle tissue - and the degree of activity when you slightly contract the muscle.  He or she will give you instructions on resting and contracting a muscle at appropriate times. Depending on what muscles and nerves the neurologist is examining, he or she may ask you to change positions during the exam.  After your EMG You may experience some temporary, minor bruising where the needle electrode was inserted into your muscle. This bruising should fade within several days. If it persists, contact your primary care doctor.

## 2022-05-02 ENCOUNTER — Ambulatory Visit: Payer: No Typology Code available for payment source | Admitting: Neurology

## 2022-05-20 ENCOUNTER — Encounter: Payer: No Typology Code available for payment source | Admitting: Neurology

## 2022-05-20 ENCOUNTER — Encounter: Payer: Self-pay | Admitting: Neurology

## 2022-05-20 DIAGNOSIS — Z029 Encounter for administrative examinations, unspecified: Secondary | ICD-10-CM

## 2022-06-11 ENCOUNTER — Ambulatory Visit (INDEPENDENT_AMBULATORY_CARE_PROVIDER_SITE_OTHER): Payer: No Typology Code available for payment source | Admitting: Neurology

## 2022-06-11 ENCOUNTER — Telehealth: Payer: Self-pay | Admitting: Neurology

## 2022-06-11 DIAGNOSIS — M62521 Muscle wasting and atrophy, not elsewhere classified, right upper arm: Secondary | ICD-10-CM

## 2022-06-11 DIAGNOSIS — F1010 Alcohol abuse, uncomplicated: Secondary | ICD-10-CM

## 2022-06-11 DIAGNOSIS — R29898 Other symptoms and signs involving the musculoskeletal system: Secondary | ICD-10-CM

## 2022-06-11 DIAGNOSIS — R202 Paresthesia of skin: Secondary | ICD-10-CM

## 2022-06-11 DIAGNOSIS — S143XXS Injury of brachial plexus, sequela: Secondary | ICD-10-CM

## 2022-06-11 NOTE — Procedures (Addendum)
[facility] Neurology  [address] [place], [address]  [place], [place] [address] Tel: [phone] Fax: [phone] Test Date:  06/11/2022  Patient: Justin Bauer DOB: 12/22/1960 Physician: Jacquelyne Balint, MD  Sex: Male Height: 6\' 2"  Ref Phys: Jacquelyne Balint, MD  ID#: [id number]   Technician:    History: This is a 62 year old male with right upper limb weakness.  NCV & EMG Findings: Extensive electrodiagnostic evaluation of the right upper limb with additional nerve conduction studies of the left upper limb shows: Absent left ulnar sensory response. Bilateral median, bilateral radial, and right ulnar show reduced amplitudes. Bilateral lateral antebrachial cutaneous sensory responses show reduced amplitude but are asymmetric (left 7 V vs right 3 V). Bilateral medial antebrachial cutaneous sensory responses are within normal limits. Right median (APB) and right ulnar (ADM) motor responses are within normal limits. Chronic motor axon loss changes WITH active denervation changes are seen in the right deltoid and biceps muscles. Chronic motor axon loss changes WITHOUT active denervation changes are seen in the right pronator teres and triceps muscles.  Impression: This is a complex study. 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 Possible overlapping active/ongoing intraspinal canal lesion (ie: motor radiculopathy) affecting the right C5 root or segment, the findings of which would be severe in degree electrically, though these findings could be related to #1 above. Evidence of possible upper limb manifestations of a large fiber sensory predominant neuropathy, axon loss in type.    ___________________________ Jacquelyne Balint, MD    Nerve Conduction  Studies Motor Nerve Results    Latency Amplitude F-Lat Segment Distance CV Comment  Site (ms) Norm (mV) Norm (ms)  (cm) (m/s) Norm   Right Median (APB) Motor  Wrist 2.5  < 4.0 10.8  > 5.0        Elbow 9.0 - 10.2 -  Elbow-Wrist 33 51  > 50   Right Ulnar (ADM) Motor  Wrist 2.5  < 3.1 8.5  > 7.0        Bel elbow 7.5 - 7.9 -  Bel elbow-Wrist 26 52  > 50   Ab elbow 9.5 - 7.2 -  Ab elbow-Bel elbow 10 50 -    Sensory Sites    Neg Peak Lat Amplitude (O-P) Segment Distance Velocity Comment  Site (ms) Norm (V) Norm  (cm) (ms)   Left Lateral Antebrachial Cutaneous Sensory  Lat biceps-Lat forearm 2.4  < 2.8 *7  > 10 Lat biceps-Lat forearm 12    Right Lateral Antebrachial Cutaneous Sensory  Lat biceps-Lat forearm 2.6  < 2.8 *3  > 10 Lat biceps-Lat forearm 12    Left Medial Antebrachial Cutaneous Sensory  Elbow-Med forearm 2.7  < 3.2 8  > 5 Elbow-Med forearm 12    Right Medial Antebrachial Cutaneous Sensory  Elbow-Med forearm 2.9  < 3.2 7  > 5 Elbow-Med forearm 12    Left Median Sensory  Wrist-Dig II 3.2  < 3.8 *6  > 10 Wrist-Dig II 13    Right Median Sensory  Wrist-Dig II 3.7  < 3.8 *6  > 10 Wrist-Dig II 13    Left Radial Sensory  Forearm-Wrist 2.3  < 2.8 *5  > 10 Forearm-Wrist 10    Right Radial Sensory  Forearm-Wrist 2.5  < 2.8 *5  >  10 Forearm-Wrist 10    Left Ulnar Sensory  Wrist-Dig V *NR  < 3.2 *NR  > 5 Wrist-Dig V 11    Right Ulnar Sensory  Wrist-Dig V *3.5  < 3.2 *4  > 5 Wrist-Dig V 11     Electromyography   Side Muscle Ins.Act Fibs Fasc Recrt Amp Dur Poly Activation Comment  Right FDI Nml Nml Nml Nml Nml Nml Nml Nml N/A  Right EIP Nml Nml Nml Nml Nml Nml Nml Nml N/A  Right Pronator teres Nml Nml Nml *2- *1+ *1+ *1+ Nml N/A  Right Biceps Nml *2+ Nml *SMU *1+ *1+ *1+ Nml N/A  Right Triceps Nml Nml Nml *1- *1+ *1+ *1+ Nml N/A  Right Deltoid Nml *2+ Nml *SMU *1+ *1+ *1+ Nml N/A      Waveforms:  Motor      Sensory
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
## 2022-10-03 ENCOUNTER — Encounter: Payer: Self-pay | Admitting: Neurology

## 2022-10-03 ENCOUNTER — Ambulatory Visit: Payer: No Typology Code available for payment source | Admitting: Neurology

## 2022-10-03 DIAGNOSIS — Z029 Encounter for administrative examinations, unspecified: Secondary | ICD-10-CM

## 2022-12-10 IMAGING — DX DG CHEST 1V PORT
1 series · 1 of 1 positions shown · non-contrast
Comparison: September 02, 2021.

CLINICAL DATA: Chest tube.

EXAM:
PORTABLE CHEST 1 VIEW

[chest]
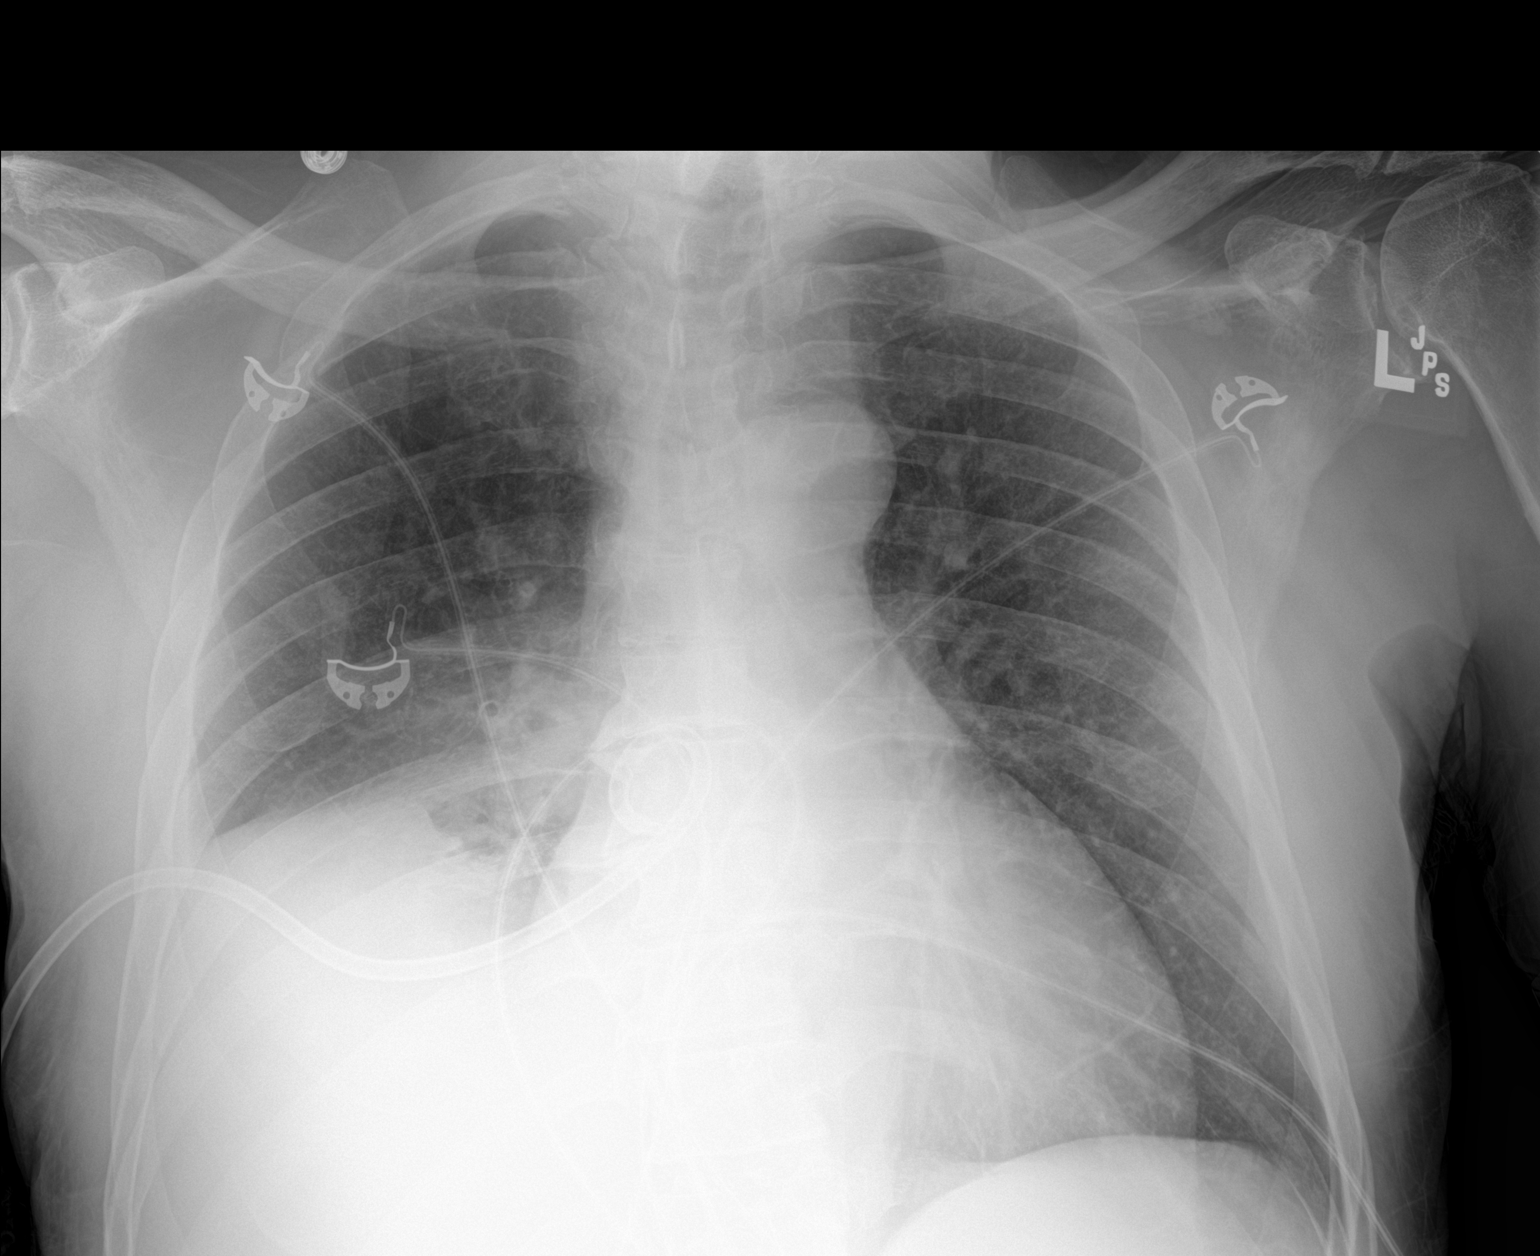

[1 of 1 positions shown; findings below may reference images not displayed]

FINDINGS: Stable cardiomediastinal silhouette. Stable position of right-sided
chest tube. No pneumothorax is noted. Stable right pleural effusion
is noted with associated right basilar atelectasis or infiltrate.
Bony thorax is unremarkable. Left lung is clear.
IMPRESSION: Stable right-sided chest tube. Stable right pleural effusion and
associated atelectasis or infiltrate.
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
## 2023-03-25 ENCOUNTER — Other Ambulatory Visit (HOSPITAL_BASED_OUTPATIENT_CLINIC_OR_DEPARTMENT_OTHER): Payer: Self-pay | Admitting: Internal Medicine

## 2023-03-25 DIAGNOSIS — R634 Abnormal weight loss: Secondary | ICD-10-CM

## 2023-04-15 ENCOUNTER — Ambulatory Visit (HOSPITAL_BASED_OUTPATIENT_CLINIC_OR_DEPARTMENT_OTHER)
Admission: RE | Admit: 2023-04-15 | Discharge: 2023-04-15 | Disposition: A | Discharge status: Home / Self Care | Payer: No Typology Code available for payment source | Source: Ambulatory Visit | Attending: Internal Medicine | Admitting: Internal Medicine

## 2023-04-15 DIAGNOSIS — R634 Abnormal weight loss: Secondary | ICD-10-CM | POA: Insufficient documentation

## 2023-04-28 ENCOUNTER — Other Ambulatory Visit (HOSPITAL_BASED_OUTPATIENT_CLINIC_OR_DEPARTMENT_OTHER): Payer: Self-pay | Admitting: Internal Medicine

## 2023-04-28 DIAGNOSIS — K769 Liver disease, unspecified: Secondary | ICD-10-CM

## 2023-05-02 ENCOUNTER — Telehealth (HOSPITAL_BASED_OUTPATIENT_CLINIC_OR_DEPARTMENT_OTHER): Payer: Self-pay

## 2023-05-14 ENCOUNTER — Ambulatory Visit (HOSPITAL_BASED_OUTPATIENT_CLINIC_OR_DEPARTMENT_OTHER): Payer: No Typology Code available for payment source

## 2023-05-28 ENCOUNTER — Ambulatory Visit (HOSPITAL_BASED_OUTPATIENT_CLINIC_OR_DEPARTMENT_OTHER)
Admission: RE | Admit: 2023-05-28 | Discharge: 2023-05-28 | Disposition: A | Discharge status: Home / Self Care | Payer: No Typology Code available for payment source | Source: Ambulatory Visit | Attending: Internal Medicine | Admitting: Internal Medicine

## 2023-05-28 DIAGNOSIS — K769 Liver disease, unspecified: Secondary | ICD-10-CM | POA: Insufficient documentation

## 2023-05-28 MED ORDER — GADOBUTROL 1 MMOL/ML IV SOLN
8.0000 mL | Freq: Once | INTRAVENOUS | Status: AC | PRN
Start: 2023-05-28 — End: 2023-05-28
  Administered 2023-05-28: 8 mL via INTRAVENOUS
